# Patient Record
Sex: Female | Born: 1980 | Race: White | Hispanic: No | Marital: Married | Smoking: Never smoker
Health system: Southern US, Community
[De-identification: ages and names within clinical notes are randomized; demographics above are authoritative.]

## PROBLEM LIST (undated history)

## (undated) ENCOUNTER — Inpatient Hospital Stay (HOSPITAL_COMMUNITY): Payer: Self-pay

## (undated) DIAGNOSIS — R51 Headache: Secondary | ICD-10-CM

## (undated) DIAGNOSIS — F329 Major depressive disorder, single episode, unspecified: Secondary | ICD-10-CM

## (undated) DIAGNOSIS — F988 Other specified behavioral and emotional disorders with onset usually occurring in childhood and adolescence: Secondary | ICD-10-CM

## (undated) DIAGNOSIS — R519 Headache, unspecified: Secondary | ICD-10-CM

## (undated) DIAGNOSIS — E063 Autoimmune thyroiditis: Secondary | ICD-10-CM

## (undated) DIAGNOSIS — E039 Hypothyroidism, unspecified: Secondary | ICD-10-CM

## (undated) DIAGNOSIS — R1310 Dysphagia, unspecified: Secondary | ICD-10-CM

## (undated) DIAGNOSIS — F32A Depression, unspecified: Secondary | ICD-10-CM

## (undated) HISTORY — DX: Autoimmune thyroiditis: E06.3

## (undated) HISTORY — DX: Headache, unspecified: R51.9

## (undated) HISTORY — DX: Other specified behavioral and emotional disorders with onset usually occurring in childhood and adolescence: F98.8

## (undated) HISTORY — DX: Dysphagia, unspecified: R13.10

## (undated) HISTORY — DX: Headache: R51

## (undated) HISTORY — PX: NO PAST SURGERIES: SHX2092

---

## 2013-08-05 LAB — OB RESULTS CONSOLE ABO/RH: RH Type: POSITIVE

## 2013-08-05 LAB — OB RESULTS CONSOLE HEPATITIS B SURFACE ANTIGEN: HEP B S AG: NEGATIVE

## 2013-08-05 LAB — OB RESULTS CONSOLE GC/CHLAMYDIA
CHLAMYDIA, DNA PROBE: NEGATIVE
GC PROBE AMP, GENITAL: NEGATIVE

## 2013-08-05 LAB — OB RESULTS CONSOLE RPR: RPR: NONREACTIVE

## 2013-08-05 LAB — OB RESULTS CONSOLE ANTIBODY SCREEN: Antibody Screen: NEGATIVE

## 2013-08-05 LAB — OB RESULTS CONSOLE HIV ANTIBODY (ROUTINE TESTING): HIV: NONREACTIVE

## 2013-08-05 LAB — OB RESULTS CONSOLE RUBELLA ANTIBODY, IGM: Rubella: IMMUNE

## 2013-09-03 ENCOUNTER — Other Ambulatory Visit: Payer: Self-pay

## 2013-09-05 ENCOUNTER — Other Ambulatory Visit (HOSPITAL_COMMUNITY): Payer: Self-pay | Admitting: Specialist

## 2013-09-05 DIAGNOSIS — O358XX1 Maternal care for other (suspected) fetal abnormality and damage, fetus 1: Principal | ICD-10-CM

## 2013-09-05 DIAGNOSIS — IMO0001 Reserved for inherently not codable concepts without codable children: Secondary | ICD-10-CM

## 2013-09-05 DIAGNOSIS — O351XX0 Maternal care for (suspected) chromosomal abnormality in fetus, not applicable or unspecified: Secondary | ICD-10-CM

## 2013-09-05 DIAGNOSIS — IMO0002 Reserved for concepts with insufficient information to code with codable children: Secondary | ICD-10-CM

## 2013-09-09 ENCOUNTER — Ambulatory Visit (HOSPITAL_COMMUNITY)
Admission: RE | Admit: 2013-09-09 | Discharge: 2013-09-09 | Disposition: A | Discharge status: Home / Self Care | Payer: Managed Care, Other (non HMO) | Source: Ambulatory Visit | Attending: Specialist | Admitting: Specialist

## 2013-09-09 ENCOUNTER — Other Ambulatory Visit (HOSPITAL_COMMUNITY): Payer: Self-pay | Admitting: Specialist

## 2013-09-09 ENCOUNTER — Encounter (HOSPITAL_COMMUNITY): Payer: Self-pay

## 2013-09-09 DIAGNOSIS — O351XX0 Maternal care for (suspected) chromosomal abnormality in fetus, not applicable or unspecified: Secondary | ICD-10-CM

## 2013-09-09 DIAGNOSIS — O3510X0 Maternal care for (suspected) chromosomal abnormality in fetus, unspecified, not applicable or unspecified: Secondary | ICD-10-CM | POA: Insufficient documentation

## 2013-09-09 DIAGNOSIS — O358XX1 Maternal care for other (suspected) fetal abnormality and damage, fetus 1: Secondary | ICD-10-CM

## 2013-09-09 DIAGNOSIS — IMO0002 Reserved for concepts with insufficient information to code with codable children: Secondary | ICD-10-CM

## 2013-09-09 DIAGNOSIS — IMO0001 Reserved for inherently not codable concepts without codable children: Secondary | ICD-10-CM

## 2013-09-09 DIAGNOSIS — O358XX0 Maternal care for other (suspected) fetal abnormality and damage, not applicable or unspecified: Secondary | ICD-10-CM | POA: Insufficient documentation

## 2013-09-09 NOTE — Progress Notes (Signed)
Genetic Counseling  High-Risk Gestation Note  Appointment Date:  09/09/2013 Referred By: Jamey Reas, MD Date of Birth:  January 23, 1981 Partner:  Meagan Woods   Pregnancy History: [id number] Estimated Date of Delivery: 03/31/14 Estimated Gestational Age: 94w0dAttending: MRenella Cunas MD  I met with Mrs. Meagan Tesorieroand her husband, Mr. Meagan Woods for genetic counseling regarding an abnormal ultrasound finding.   Mrs. Meagan Starkhad a routine nuchal translucency ultrasound through her primary obstetrician's office, which revealed an increased nuchal translucency versus a cystic hygroma.  [facility].  A limited ultrasound was performed today.  The report will be documented separately.  Ultrasound today revealed a fetal cystic hygroma.    The couple was counseled that a cystic hygroma describes a septated fluid filled sac at the back of the neck that typically results from failure of the fetal lymphatic system.  We discussed the various etiologies for a hygroma including normal variation (immature lymphatic system), a chromosome condition, single gene condition, or a congenital anomaly (heart defect).    We discussed chromosomes, non-disjunction, age related risks for aneuploidy, and the ~50% ultrasound adjusted risk for a chromosome condition based on the finding of a cystic hygroma.  We discussed the common features of Down syndrome, Turner syndrome, and trisomies 13 and 18.  We also discussed that cystic hygromas can also be caused by a variety of other chromosome aberrations including: microdeletions, microduplications, and translocations.    We reviewed available screening options including noninvasive prenatal screening (NIPS)/cell free DNA (cfDNA) testing and detailed ultrasound.  They were counseled that screening tests are used to modify a patient's a priori  risk for aneuploidy, typically based on age. This estimate provides a pregnancy specific risk assessment. We reviewed the benefits and limitations of each option. Specifically, we discussed the conditions for which each test screens, the detection rates, and false positive rates of each. They were also counseled regarding diagnostic testing via CVS and amniocentesis. We reviewed the approximate 1 in 1774risk for complications for CVS and the approximate 1 in 3142-395risk for complications for amniocentesis, including spontaneous pregnancy loss.  Additionally, we discussed that microarray analysis can be performed on cells obtained from amniocentesis.  Microarray analysis is a molecular based technique in which a test sample of DNA (fetal) is compared to a reference (normal) genome in order to determine if the test sample has any extra or missing genetic information.  Microarray analysis allows for the detection of genetic deletions and duplications that are 1320times smaller than those identified by routine chromosome analysis.  Recent publications show that approximately 6% of patients with an abnormal fetal ultrasound and a normal fetal karyotype had a significant microdeletion/microduplication detected by prenatal microarray analysis.   After consideration of all the options, the couple declined NIPS, CVS, and amniocentesis at this time. They stated that they needed additional time to consider these screening and testing options and will contact our office should they elect to pursue any of them. They elected to proceed with follow-up ultrasound. Follow-up ultrasound was scheduled for 10/17/13, and detailed ultrasound scheduled for 11/17/13.       We also discussed that a cystic hygroma can be a feature of many underlying single gene conditions, such as Noonan syndrome, SLOS, and skeletal dysplasias.  We discussed that prenatal diagnosis is available for some single gene conditions, but that in most cases  targeted analysis is recommended by a medical geneticist following a post-natal physical exam and review of medical records.  If however, ultrasound findings or a positive family history strongly suggest an increased risk for a specific condition, testing may be performed prenatally.     Additionally, we discussed that congenital heart defects (CHD) are commonly associated with cystic hygromas.  The prevalence of a CHD is in the order of 6 times higher in fetuses with a cystic hygroma/increased nuchal translucency as compared to an unselected population.  There does not appear to be an association between any specific CHD and cystic hygromas.   We discussed that the chance for a CHD increases exponentially with increasing NT measurements.  For this reason, we discussed the option of a fetal echocardiogram, which will be available in the second trimester.    We discussed that the fetal prognosis is largely dependent upon the underlying cause of the hygroma, but that there are indications by ultrasound, such as hydrops, which significantly increase the likelihood of a poor prognosis.  She understands that cystic hygromas may worsen and progress to hydrops fetalis, improve and regress, or remain unchanged at follow up ultrasounds.  The majority of fetuses with a normal karyotype, normal detailed anatomy ultrasound, and normal fetal echocardiogram will have an uneventful outcome.  We discussed that while many of the causes of a cystic hygroma can be investigated during pregnancy, not all conditions and causes can be determined prenatally.     Both family histories were reviewed and found to be noncontributory for birth defects, intellectual disability, and known genetic conditions. Without further information regarding the provided family history, an accurate genetic risk cannot be calculated. Further genetic counseling is warranted if more information is obtained.   [name] was provided with written  information regarding cystic fibrosis (CF) including the carrier frequency and incidence in the Caucasian population, the availability of carrier testing and prenatal diagnosis if indicated.  In addition, we discussed that CF is routinely screened for as part of the [place] newborn screening panel.  She reported that CF carrier screening was likely done in her previous pregnancy and was normal. We do not currently have documentation from the patient's previous pregnancy.    [name] denied exposure to environmental toxins or chemical agents. She denied the use of alcohol, tobacco or street drugs. She denied significant viral illnesses during the course of her pregnancy.  Her medication history was significant for hypothyroidism, for which she is taking Synthroid.    I counseled the couple regarding the above risks and available options.  The approximate face-to-face time with the genetic counselor was 35 minutes.     Chipper Oman, MS Certified Genetic Counselor 09/09/2013

## 2013-09-11 ENCOUNTER — Other Ambulatory Visit (HOSPITAL_COMMUNITY): Payer: Self-pay | Admitting: Specialist

## 2013-09-11 ENCOUNTER — Telehealth (HOSPITAL_COMMUNITY): Payer: Self-pay | Admitting: MS"

## 2013-09-11 DIAGNOSIS — IMO0001 Reserved for inherently not codable concepts without codable children: Secondary | ICD-10-CM

## 2013-09-11 DIAGNOSIS — O358XX1 Maternal care for other (suspected) fetal abnormality and damage, fetus 1: Principal | ICD-10-CM

## 2013-09-11 NOTE — Telephone Encounter (Signed)
[name] called back to review testing options. Patient was seen in our office on 6/23 and had ultrasound finding of cystic hygroma. We reviewed screening (noninvasive prenatal screening) versus diagnostic testing (CVS and amniocentesis) for chromosome conditions. We reviewed the conditions for which each screens and tests and reviewed the risks, benefits, and limitations of each. [name] stated that she had been reading and was leaning towards having a CVS procedure given the timing and amount of information it can provide compared to other screening and testing options. We specifically reviewed the risk of complications of approximately 1 in 100 and the chances for maternal cell contamination and confined placental mosaicism. [name] indicated that she would not alter her pregnancy course but was interested in pursuing this testing for information gathering and planning purposes. Discussed that Dr. Nelda Severe is back in the [facility] office on Tuesday, 6/30, and that CVS could be arranged for that date. Planned to investigate further regarding the time for CVS to be scheduled that date and stated that I would call her back.   Santiago Glad Corneliussen 09/11/2013 10:55 AM    Called [name] back to inform her that CVS is scheduled for Tuesday, 6/30 at 9:15 am at the [facility] [place] with Dr. Nelda Severe.   Santiago Glad Corneliussen 10:58 AM 09/11/2013

## 2013-09-16 ENCOUNTER — Other Ambulatory Visit: Payer: Self-pay

## 2013-09-16 ENCOUNTER — Ambulatory Visit (HOSPITAL_COMMUNITY)
Admission: RE | Admit: 2013-09-16 | Discharge: 2013-09-16 | Disposition: A | Discharge status: Home / Self Care | Payer: Managed Care, Other (non HMO) | Source: Ambulatory Visit | Attending: Specialist | Admitting: Specialist

## 2013-09-16 ENCOUNTER — Encounter (HOSPITAL_COMMUNITY): Payer: Self-pay

## 2013-09-16 DIAGNOSIS — Z3689 Encounter for other specified antenatal screening: Secondary | ICD-10-CM | POA: Insufficient documentation

## 2013-09-16 DIAGNOSIS — O358XX0 Maternal care for other (suspected) fetal abnormality and damage, not applicable or unspecified: Secondary | ICD-10-CM | POA: Insufficient documentation

## 2013-09-16 DIAGNOSIS — O30009 Twin pregnancy, unspecified number of placenta and unspecified number of amniotic sacs, unspecified trimester: Secondary | ICD-10-CM | POA: Insufficient documentation

## 2013-09-16 DIAGNOSIS — IMO0001 Reserved for inherently not codable concepts without codable children: Secondary | ICD-10-CM

## 2013-09-16 DIAGNOSIS — O358XX1 Maternal care for other (suspected) fetal abnormality and damage, fetus 1: Secondary | ICD-10-CM

## 2013-09-16 LAB — ROUTINE CHROMOSOME - KARYOTYPE + FISH

## 2013-09-17 ENCOUNTER — Telehealth (HOSPITAL_COMMUNITY): Payer: Self-pay | Admitting: MS"

## 2013-09-17 NOTE — Telephone Encounter (Signed)
Called Meagan Woods to discuss the preliminary FISH results from her CVS. We reviewed that these are within normal limits.  We again discussed the limitations of FISH and that final results are still pending and will be available in 1-2 weeks.  Patient reported that she has not noticed any concerning symptoms or complications following the procedure.  All questions were answered to her satisfaction, she was encouraged to call with additional questions or concerns.  Chipper Oman,  MS Certified Genetic Counselor 09/17/2013 11:54 AM

## 2013-09-17 NOTE — Telephone Encounter (Signed)
Left message for patient to return call.   Meagan Woods 09/17/2013 11:38 AM

## 2013-09-18 ENCOUNTER — Other Ambulatory Visit (HOSPITAL_COMMUNITY): Payer: Self-pay | Admitting: Maternal and Fetal Medicine

## 2013-09-18 DIAGNOSIS — IMO0002 Reserved for concepts with insufficient information to code with codable children: Secondary | ICD-10-CM

## 2013-09-18 DIAGNOSIS — O358XX1 Maternal care for other (suspected) fetal abnormality and damage, fetus 1: Secondary | ICD-10-CM

## 2013-09-18 DIAGNOSIS — Z0489 Encounter for examination and observation for other specified reasons: Secondary | ICD-10-CM

## 2013-09-18 DIAGNOSIS — IMO0001 Reserved for inherently not codable concepts without codable children: Secondary | ICD-10-CM

## 2013-09-18 DIAGNOSIS — O283 Abnormal ultrasonic finding on antenatal screening of mother: Secondary | ICD-10-CM

## 2013-09-22 ENCOUNTER — Telehealth (HOSPITAL_COMMUNITY): Payer: Self-pay | Admitting: MS"

## 2013-09-22 NOTE — Telephone Encounter (Signed)
[name] called back to ask about the measurement of the cystic hygroma. Discussed that her initial ultrasound report with [place] reports a nuchal translucency measurement of 4.1 mm. Patient had no other questions at this time.   Santiago Glad [name] 09/22/2013 9:00 AM

## 2013-09-26 ENCOUNTER — Telehealth (HOSPITAL_COMMUNITY): Payer: Self-pay | Admitting: MS"

## 2013-09-26 NOTE — Telephone Encounter (Signed)
Called Ambert Virrueta to discuss the final karyotype results from her CVS. We reviewed that these are within normal limits (72, XY).  We discussed the availability of prenatal microarray analysis. Microarray analysis allows for the detection of genetic deletions and duplications that are 833 times smaller than those identified by routine chromosome analysis.  We discussed that publications show that approximately 6% of patients with an abnormal fetal ultrasound and a normal fetal karyotype had a microdeletion/microduplication detected by prenatal microarray analysis. However, the significance of that copy number variant may not be known. Mrs. Cambre planned to discuss this option with her husband and call us back regarding whether or not they would like to pursue this testing. We reviewed that this testing did not/does not assess for single gene conditions. Mrs. Trigueros asked about next steps for the pregnancy. Reviewed the option of anatomy ultrasound, which is scheduled in our office, as well as fetal echocardiogram, which can be facilitated at their next visit. All questions were answered to her satisfaction, she was encouraged to call with additional questions or concerns.  Chipper Oman, MS Insurance risk surveyor

## 2013-09-30 ENCOUNTER — Telehealth (HOSPITAL_COMMUNITY): Payer: Self-pay | Admitting: MS"

## 2013-09-30 NOTE — Telephone Encounter (Signed)
[name] called and left message stating that she and her husband do not want to pursue with chromosome microarray analysis from CVS.   [name] 09/30/2013 11:09 AM

## 2013-10-17 ENCOUNTER — Encounter (HOSPITAL_COMMUNITY): Payer: Self-pay

## 2013-10-17 ENCOUNTER — Ambulatory Visit (HOSPITAL_COMMUNITY)
Admission: RE | Admit: 2013-10-17 | Discharge: 2013-10-17 | Disposition: A | Discharge status: Home / Self Care | Payer: Managed Care, Other (non HMO) | Source: Ambulatory Visit | Attending: Specialist | Admitting: Specialist

## 2013-10-17 DIAGNOSIS — O358XX0 Maternal care for other (suspected) fetal abnormality and damage, not applicable or unspecified: Secondary | ICD-10-CM | POA: Insufficient documentation

## 2013-10-17 DIAGNOSIS — O283 Abnormal ultrasonic finding on antenatal screening of mother: Secondary | ICD-10-CM

## 2013-10-17 DIAGNOSIS — Z3689 Encounter for other specified antenatal screening: Secondary | ICD-10-CM | POA: Insufficient documentation

## 2013-10-17 DIAGNOSIS — O289 Unspecified abnormal findings on antenatal screening of mother: Secondary | ICD-10-CM | POA: Insufficient documentation

## 2013-10-17 DIAGNOSIS — O358XX1 Maternal care for other (suspected) fetal abnormality and damage, fetus 1: Secondary | ICD-10-CM

## 2013-10-17 DIAGNOSIS — IMO0001 Reserved for inherently not codable concepts without codable children: Secondary | ICD-10-CM

## 2013-10-31 ENCOUNTER — Encounter (HOSPITAL_COMMUNITY): Payer: Self-pay

## 2013-10-31 ENCOUNTER — Ambulatory Visit (HOSPITAL_COMMUNITY)
Admission: RE | Admit: 2013-10-31 | Discharge: 2013-10-31 | Disposition: A | Discharge status: Home / Self Care | Payer: Managed Care, Other (non HMO) | Source: Ambulatory Visit | Attending: Specialist | Admitting: Specialist

## 2013-10-31 VITALS — BP 127/68 | HR 104 | Wt 173.0 lb

## 2013-10-31 DIAGNOSIS — O289 Unspecified abnormal findings on antenatal screening of mother: Secondary | ICD-10-CM | POA: Diagnosis not present

## 2013-10-31 DIAGNOSIS — O283 Abnormal ultrasonic finding on antenatal screening of mother: Secondary | ICD-10-CM

## 2013-10-31 DIAGNOSIS — O358XX0 Maternal care for other (suspected) fetal abnormality and damage, not applicable or unspecified: Secondary | ICD-10-CM | POA: Diagnosis present

## 2013-10-31 DIAGNOSIS — Z1389 Encounter for screening for other disorder: Secondary | ICD-10-CM | POA: Insufficient documentation

## 2013-10-31 DIAGNOSIS — Z0489 Encounter for examination and observation for other specified reasons: Secondary | ICD-10-CM

## 2013-10-31 DIAGNOSIS — Z363 Encounter for antenatal screening for malformations: Secondary | ICD-10-CM | POA: Insufficient documentation

## 2013-10-31 DIAGNOSIS — IMO0001 Reserved for inherently not codable concepts without codable children: Secondary | ICD-10-CM

## 2013-10-31 DIAGNOSIS — IMO0002 Reserved for concepts with insufficient information to code with codable children: Secondary | ICD-10-CM

## 2013-10-31 DIAGNOSIS — O358XX1 Maternal care for other (suspected) fetal abnormality and damage, fetus 1: Secondary | ICD-10-CM

## 2013-12-09 ENCOUNTER — Ambulatory Visit (HOSPITAL_COMMUNITY)
Admission: RE | Admit: 2013-12-09 | Discharge: 2013-12-09 | Disposition: A | Discharge status: Home / Self Care | Payer: Managed Care, Other (non HMO) | Source: Ambulatory Visit | Attending: Specialist | Admitting: Specialist

## 2013-12-09 ENCOUNTER — Encounter (HOSPITAL_COMMUNITY): Payer: Self-pay

## 2013-12-09 DIAGNOSIS — O358XX0 Maternal care for other (suspected) fetal abnormality and damage, not applicable or unspecified: Secondary | ICD-10-CM | POA: Diagnosis not present

## 2013-12-09 DIAGNOSIS — IMO0001 Reserved for inherently not codable concepts without codable children: Secondary | ICD-10-CM

## 2013-12-09 DIAGNOSIS — O358XX1 Maternal care for other (suspected) fetal abnormality and damage, fetus 1: Secondary | ICD-10-CM

## 2013-12-26 ENCOUNTER — Encounter: Payer: Self-pay | Admitting: Specialist

## 2014-01-09 ENCOUNTER — Other Ambulatory Visit (HOSPITAL_COMMUNITY): Payer: Self-pay | Admitting: Maternal and Fetal Medicine

## 2014-01-09 DIAGNOSIS — E039 Hypothyroidism, unspecified: Secondary | ICD-10-CM

## 2014-01-09 DIAGNOSIS — O99283 Endocrine, nutritional and metabolic diseases complicating pregnancy, third trimester: Secondary | ICD-10-CM

## 2014-01-09 DIAGNOSIS — D181 Lymphangioma, any site: Secondary | ICD-10-CM

## 2014-01-16 LAB — OB RESULTS CONSOLE GBS: GBS: NEGATIVE

## 2014-01-19 ENCOUNTER — Encounter (HOSPITAL_COMMUNITY): Payer: Self-pay

## 2014-01-23 ENCOUNTER — Encounter (HOSPITAL_COMMUNITY): Payer: Self-pay

## 2014-01-23 ENCOUNTER — Other Ambulatory Visit (HOSPITAL_COMMUNITY): Payer: Self-pay | Admitting: Maternal and Fetal Medicine

## 2014-01-23 ENCOUNTER — Ambulatory Visit (HOSPITAL_COMMUNITY)
Admission: RE | Admit: 2014-01-23 | Discharge: 2014-01-23 | Disposition: A | Discharge status: Home / Self Care | Payer: Managed Care, Other (non HMO) | Source: Ambulatory Visit | Attending: Obstetrics and Gynecology | Admitting: Obstetrics and Gynecology

## 2014-01-23 VITALS — BP 115/62 | HR 93 | Wt 186.0 lb

## 2014-01-23 DIAGNOSIS — D181 Lymphangioma, any site: Secondary | ICD-10-CM

## 2014-01-23 DIAGNOSIS — O403XX0 Polyhydramnios, third trimester, not applicable or unspecified: Secondary | ICD-10-CM

## 2014-01-23 DIAGNOSIS — O99283 Endocrine, nutritional and metabolic diseases complicating pregnancy, third trimester: Secondary | ICD-10-CM | POA: Diagnosis not present

## 2014-01-23 DIAGNOSIS — Z3A30 30 weeks gestation of pregnancy: Secondary | ICD-10-CM | POA: Diagnosis not present

## 2014-01-23 DIAGNOSIS — IMO0002 Reserved for concepts with insufficient information to code with codable children: Secondary | ICD-10-CM | POA: Insufficient documentation

## 2014-01-23 DIAGNOSIS — O358XX0 Maternal care for other (suspected) fetal abnormality and damage, not applicable or unspecified: Secondary | ICD-10-CM | POA: Insufficient documentation

## 2014-01-23 DIAGNOSIS — E039 Hypothyroidism, unspecified: Secondary | ICD-10-CM | POA: Insufficient documentation

## 2014-01-23 HISTORY — DX: Major depressive disorder, single episode, unspecified: F32.9

## 2014-01-23 HISTORY — DX: Hypothyroidism, unspecified: E03.9

## 2014-01-23 HISTORY — DX: Depression, unspecified: F32.A

## 2014-02-06 ENCOUNTER — Other Ambulatory Visit (HOSPITAL_COMMUNITY): Payer: Self-pay | Admitting: Maternal and Fetal Medicine

## 2014-02-06 ENCOUNTER — Ambulatory Visit (HOSPITAL_COMMUNITY)
Admission: RE | Admit: 2014-02-06 | Discharge: 2014-02-06 | Disposition: A | Discharge status: Home / Self Care | Payer: Managed Care, Other (non HMO) | Source: Ambulatory Visit | Attending: Obstetrics and Gynecology | Admitting: Obstetrics and Gynecology

## 2014-02-06 ENCOUNTER — Encounter (HOSPITAL_COMMUNITY): Payer: Self-pay

## 2014-02-06 DIAGNOSIS — Z3A32 32 weeks gestation of pregnancy: Secondary | ICD-10-CM | POA: Diagnosis not present

## 2014-02-06 DIAGNOSIS — O99283 Endocrine, nutritional and metabolic diseases complicating pregnancy, third trimester: Secondary | ICD-10-CM | POA: Diagnosis not present

## 2014-02-06 DIAGNOSIS — D181 Lymphangioma, any site: Secondary | ICD-10-CM

## 2014-02-06 DIAGNOSIS — O358XX0 Maternal care for other (suspected) fetal abnormality and damage, not applicable or unspecified: Secondary | ICD-10-CM | POA: Insufficient documentation

## 2014-02-06 DIAGNOSIS — E039 Hypothyroidism, unspecified: Secondary | ICD-10-CM | POA: Insufficient documentation

## 2014-02-06 DIAGNOSIS — O403XX0 Polyhydramnios, third trimester, not applicable or unspecified: Secondary | ICD-10-CM | POA: Diagnosis present

## 2014-02-06 DIAGNOSIS — O359XX0 Maternal care for (suspected) fetal abnormality and damage, unspecified, not applicable or unspecified: Secondary | ICD-10-CM | POA: Insufficient documentation

## 2014-02-19 ENCOUNTER — Ambulatory Visit (HOSPITAL_COMMUNITY)
Admission: RE | Admit: 2014-02-19 | Discharge: 2014-02-19 | Disposition: A | Discharge status: Home / Self Care | Payer: Managed Care, Other (non HMO) | Source: Ambulatory Visit | Attending: Obstetrics and Gynecology | Admitting: Obstetrics and Gynecology

## 2014-02-19 ENCOUNTER — Other Ambulatory Visit: Payer: Self-pay

## 2014-02-19 ENCOUNTER — Encounter (HOSPITAL_COMMUNITY): Payer: Self-pay

## 2014-02-19 ENCOUNTER — Other Ambulatory Visit (HOSPITAL_COMMUNITY): Payer: Self-pay | Admitting: Obstetrics and Gynecology

## 2014-02-19 DIAGNOSIS — E039 Hypothyroidism, unspecified: Secondary | ICD-10-CM | POA: Insufficient documentation

## 2014-02-19 DIAGNOSIS — D181 Lymphangioma, any site: Secondary | ICD-10-CM | POA: Insufficient documentation

## 2014-02-19 DIAGNOSIS — Z3A34 34 weeks gestation of pregnancy: Secondary | ICD-10-CM | POA: Diagnosis not present

## 2014-02-19 DIAGNOSIS — IMO0001 Reserved for inherently not codable concepts without codable children: Secondary | ICD-10-CM

## 2014-02-19 DIAGNOSIS — O359XX1 Maternal care for (suspected) fetal abnormality and damage, unspecified, fetus 1: Secondary | ICD-10-CM

## 2014-02-19 DIAGNOSIS — O403XX1 Polyhydramnios, third trimester, fetus 1: Secondary | ICD-10-CM

## 2014-02-19 DIAGNOSIS — O403XX0 Polyhydramnios, third trimester, not applicable or unspecified: Secondary | ICD-10-CM | POA: Diagnosis present

## 2014-02-19 DIAGNOSIS — O99283 Endocrine, nutritional and metabolic diseases complicating pregnancy, third trimester: Secondary | ICD-10-CM | POA: Diagnosis not present

## 2014-02-19 DIAGNOSIS — G930 Cerebral cysts: Secondary | ICD-10-CM

## 2014-02-19 DIAGNOSIS — O358XX1 Maternal care for other (suspected) fetal abnormality and damage, fetus 1: Secondary | ICD-10-CM

## 2014-02-19 DIAGNOSIS — O358XX0 Maternal care for other (suspected) fetal abnormality and damage, not applicable or unspecified: Secondary | ICD-10-CM | POA: Insufficient documentation

## 2014-03-16 ENCOUNTER — Ambulatory Visit (HOSPITAL_COMMUNITY)
Admission: RE | Admit: 2014-03-16 | Discharge: 2014-03-16 | Disposition: A | Discharge status: Home / Self Care | Payer: Managed Care, Other (non HMO) | Source: Ambulatory Visit | Attending: Obstetrics and Gynecology | Admitting: Obstetrics and Gynecology

## 2014-03-16 ENCOUNTER — Encounter (HOSPITAL_COMMUNITY): Payer: Self-pay

## 2014-03-16 DIAGNOSIS — O99280 Endocrine, nutritional and metabolic diseases complicating pregnancy, unspecified trimester: Secondary | ICD-10-CM

## 2014-03-16 DIAGNOSIS — O358XX0 Maternal care for other (suspected) fetal abnormality and damage, not applicable or unspecified: Secondary | ICD-10-CM | POA: Diagnosis not present

## 2014-03-16 DIAGNOSIS — E039 Hypothyroidism, unspecified: Secondary | ICD-10-CM | POA: Diagnosis not present

## 2014-03-16 DIAGNOSIS — O403XX1 Polyhydramnios, third trimester, fetus 1: Secondary | ICD-10-CM

## 2014-03-16 DIAGNOSIS — G930 Cerebral cysts: Secondary | ICD-10-CM

## 2014-03-16 DIAGNOSIS — O99283 Endocrine, nutritional and metabolic diseases complicating pregnancy, third trimester: Secondary | ICD-10-CM | POA: Diagnosis not present

## 2014-03-16 DIAGNOSIS — O350XX0 Maternal care for (suspected) central nervous system malformation in fetus, not applicable or unspecified: Secondary | ICD-10-CM | POA: Diagnosis not present

## 2014-03-16 DIAGNOSIS — O403XX0 Polyhydramnios, third trimester, not applicable or unspecified: Secondary | ICD-10-CM | POA: Insufficient documentation

## 2014-03-16 DIAGNOSIS — IMO0001 Reserved for inherently not codable concepts without codable children: Secondary | ICD-10-CM

## 2014-03-16 DIAGNOSIS — O359XX1 Maternal care for (suspected) fetal abnormality and damage, unspecified, fetus 1: Secondary | ICD-10-CM

## 2014-03-16 DIAGNOSIS — Z3A37 37 weeks gestation of pregnancy: Secondary | ICD-10-CM | POA: Diagnosis not present

## 2014-03-16 DIAGNOSIS — O358XX1 Maternal care for other (suspected) fetal abnormality and damage, fetus 1: Secondary | ICD-10-CM

## 2014-03-19 ENCOUNTER — Ambulatory Visit (HOSPITAL_COMMUNITY): Payer: Managed Care, Other (non HMO)

## 2014-03-23 ENCOUNTER — Telehealth (HOSPITAL_COMMUNITY): Payer: Self-pay | Admitting: *Deleted

## 2014-03-23 ENCOUNTER — Encounter (HOSPITAL_COMMUNITY): Payer: Self-pay | Admitting: *Deleted

## 2014-03-23 NOTE — Telephone Encounter (Signed)
Preadmission screen  

## 2014-03-24 ENCOUNTER — Inpatient Hospital Stay (HOSPITAL_COMMUNITY): Payer: Managed Care, Other (non HMO) | Admitting: Anesthesiology

## 2014-03-24 ENCOUNTER — Inpatient Hospital Stay (HOSPITAL_COMMUNITY)
Admission: RE | Admit: 2014-03-24 | Discharge: 2014-03-26 | DRG: 775 | Disposition: A | Discharge status: Home / Self Care | Payer: Managed Care, Other (non HMO) | Source: Ambulatory Visit | Attending: Obstetrics and Gynecology | Admitting: Obstetrics and Gynecology

## 2014-03-24 ENCOUNTER — Encounter (HOSPITAL_COMMUNITY): Payer: Self-pay

## 2014-03-24 ENCOUNTER — Inpatient Hospital Stay (HOSPITAL_COMMUNITY)
Admission: AD | Admit: 2014-03-24 | Discharge: 2014-03-24 | Disposition: A | Discharge status: Home / Self Care | Payer: Managed Care, Other (non HMO) | Source: Ambulatory Visit | Attending: Obstetrics and Gynecology | Admitting: Obstetrics and Gynecology

## 2014-03-24 DIAGNOSIS — Z3A39 39 weeks gestation of pregnancy: Secondary | ICD-10-CM | POA: Diagnosis present

## 2014-03-24 DIAGNOSIS — O139 Gestational [pregnancy-induced] hypertension without significant proteinuria, unspecified trimester: Secondary | ICD-10-CM | POA: Diagnosis present

## 2014-03-24 DIAGNOSIS — Z3483 Encounter for supervision of other normal pregnancy, third trimester: Secondary | ICD-10-CM | POA: Diagnosis present

## 2014-03-24 LAB — CBC
HCT: 33.4 % — ABNORMAL LOW (ref 36.0–46.0)
Hemoglobin: 10.6 g/dL — ABNORMAL LOW (ref 12.0–15.0)
MCH: 25.9 pg — AB (ref 26.0–34.0)
MCHC: 31.7 g/dL (ref 30.0–36.0)
MCV: 81.5 fL (ref 78.0–100.0)
Platelets: 261 10*3/uL (ref 150–400)
RBC: 4.1 MIL/uL (ref 3.87–5.11)
RDW: 15.8 % — ABNORMAL HIGH (ref 11.5–15.5)
WBC: 12.1 10*3/uL — ABNORMAL HIGH (ref 4.0–10.5)

## 2014-03-24 LAB — TYPE AND SCREEN
ABO/RH(D): A POS
Antibody Screen: NEGATIVE

## 2014-03-24 MED ORDER — TERBUTALINE SULFATE 1 MG/ML IJ SOLN: 0.2500 mg | Freq: Once | INTRAMUSCULAR | Status: AC | PRN

## 2014-03-24 MED ORDER — MISOPROSTOL 25 MCG QUARTER TABLET
25.0000 ug | ORAL_TABLET | ORAL | Status: DC | PRN
  Administered 2014-03-24: 25 ug via VAGINAL
  Filled 2014-03-24: qty 0.25
  Filled 2014-03-24: qty 1

## 2014-03-24 MED ORDER — ONDANSETRON HCL 4 MG/2ML IJ SOLN
4.0000 mg | Freq: Four times a day (QID) | INTRAMUSCULAR | Status: DC | PRN
Start: 2014-03-24 — End: 2014-03-25

## 2014-03-24 MED ORDER — CITRIC ACID-SODIUM CITRATE 334-500 MG/5ML PO SOLN
30.0000 mL | ORAL | Status: DC | PRN
  Filled 2014-03-24: qty 15

## 2014-03-24 MED ORDER — OXYCODONE-ACETAMINOPHEN 5-325 MG PO TABS: 2.0000 | ORAL_TABLET | ORAL | Status: DC | PRN

## 2014-03-24 MED ORDER — ZOLPIDEM TARTRATE 5 MG PO TABS: 5.0000 mg | ORAL_TABLET | Freq: Every evening | ORAL | Status: DC | PRN

## 2014-03-24 MED ORDER — OXYTOCIN BOLUS FROM INFUSION
500.0000 mL | INTRAVENOUS | Status: DC
  Administered 2014-03-25: 500 mL via INTRAVENOUS

## 2014-03-24 MED ORDER — LACTATED RINGERS IV SOLN
500.0000 mL | Freq: Once | INTRAVENOUS | Status: AC
  Administered 2014-03-24: 500 mL via INTRAVENOUS

## 2014-03-24 MED ORDER — OXYTOCIN 40 UNITS IN LACTATED RINGERS INFUSION - SIMPLE MED
62.5000 mL/h | INTRAVENOUS | Status: DC
  Filled 2014-03-24: qty 1000

## 2014-03-24 MED ORDER — OXYCODONE-ACETAMINOPHEN 5-325 MG PO TABS: 1.0000 | ORAL_TABLET | ORAL | Status: DC | PRN

## 2014-03-24 MED ORDER — PHENYLEPHRINE 40 MCG/ML (10ML) SYRINGE FOR IV PUSH (FOR BLOOD PRESSURE SUPPORT)
80.0000 ug | PREFILLED_SYRINGE | INTRAVENOUS | Status: DC | PRN
  Filled 2014-03-24: qty 2

## 2014-03-24 MED ORDER — ACETAMINOPHEN 325 MG PO TABS: 650.0000 mg | ORAL_TABLET | ORAL | Status: DC | PRN

## 2014-03-24 MED ORDER — LACTATED RINGERS IV SOLN: 500.0000 mL | INTRAVENOUS | Status: DC | PRN

## 2014-03-24 MED ORDER — EPHEDRINE 5 MG/ML INJ
10.0000 mg | INTRAVENOUS | Status: DC | PRN
  Filled 2014-03-24: qty 2

## 2014-03-24 MED ORDER — BUTORPHANOL TARTRATE 1 MG/ML IJ SOLN
1.0000 mg | INTRAMUSCULAR | Status: DC | PRN
  Administered 2014-03-24: 1 mg via INTRAVENOUS
  Filled 2014-03-24: qty 1

## 2014-03-24 MED ORDER — FENTANYL 2.5 MCG/ML BUPIVACAINE 1/10 % EPIDURAL INFUSION (WH - ANES)
14.0000 mL/h | INTRAMUSCULAR | Status: DC | PRN
  Administered 2014-03-25: 14 mL/h via EPIDURAL
  Filled 2014-03-24: qty 125

## 2014-03-24 MED ORDER — LIDOCAINE HCL (PF) 1 % IJ SOLN
30.0000 mL | INTRAMUSCULAR | Status: DC | PRN
  Filled 2014-03-24: qty 30

## 2014-03-24 MED ORDER — PHENYLEPHRINE 40 MCG/ML (10ML) SYRINGE FOR IV PUSH (FOR BLOOD PRESSURE SUPPORT)
80.0000 ug | PREFILLED_SYRINGE | INTRAVENOUS | Status: DC | PRN
  Filled 2014-03-24: qty 20
  Filled 2014-03-24: qty 2

## 2014-03-24 MED ORDER — LACTATED RINGERS IV SOLN
INTRAVENOUS | Status: DC
  Administered 2014-03-24: 20:00:00 via INTRAVENOUS

## 2014-03-24 MED ORDER — DIPHENHYDRAMINE HCL 50 MG/ML IJ SOLN: 12.5000 mg | INTRAMUSCULAR | Status: DC | PRN

## 2014-03-24 NOTE — MAU Note (Signed)
Error, induction

## 2014-03-24 NOTE — Anesthesia Preprocedure Evaluation (Addendum)
Anesthesia Evaluation  Patient identified by MRN, date of birth, ID band Patient awake    Reviewed: Allergy & Precautions, NPO status , Patient's Chart, lab work & pertinent test results  Airway Mallampati: III  TM Distance: >3 FB Neck ROM: Full    Dental no notable dental hx. (+) Teeth Intact   Pulmonary neg pulmonary ROS,  breath sounds clear to auscultation  Pulmonary exam normal       Cardiovascular hypertension, negative cardio ROS  Rhythm:Regular Rate:Normal     Neuro/Psych  Headaches, PSYCHIATRIC DISORDERS Depression    GI/Hepatic negative GI ROS, Neg liver ROS,   Endo/Other  Hypothyroidism Morbid obesity  Renal/GU   negative genitourinary   Musculoskeletal negative musculoskeletal ROS (+)   Abdominal (+) + obese,   Peds  Hematology  (+) anemia ,   Anesthesia Other Findings   Reproductive/Obstetrics (+) Pregnancy                            Anesthesia Physical Anesthesia Plan  ASA: III  Anesthesia Plan: Epidural   Post-op Pain Management:    Induction:   Airway Management Planned: Natural Airway  Additional Equipment:   Intra-op Plan:   Post-operative Plan:   Informed Consent: I have reviewed the patients History and Physical, chart, labs and discussed the procedure including the risks, benefits and alternatives for the proposed anesthesia with the patient or authorized representative who has indicated his/her understanding and acceptance.     Plan Discussed with: Anesthesiologist  Anesthesia Plan Comments:        Anesthesia Quick Evaluation

## 2014-03-25 ENCOUNTER — Encounter (HOSPITAL_COMMUNITY): Payer: Self-pay

## 2014-03-25 LAB — ABO/RH: ABO/RH(D): A POS

## 2014-03-25 LAB — RPR

## 2014-03-25 MED ORDER — CITALOPRAM HYDROBROMIDE 20 MG PO TABS
20.0000 mg | ORAL_TABLET | Freq: Every day | ORAL | Status: DC
  Administered 2014-03-25: 20 mg via ORAL
  Filled 2014-03-25 (×2): qty 1

## 2014-03-25 MED ORDER — LIDOCAINE HCL (PF) 1 % IJ SOLN
INTRAMUSCULAR | Status: DC | PRN
  Administered 2014-03-25 (×2): 4 mL

## 2014-03-25 MED ORDER — OXYCODONE-ACETAMINOPHEN 5-325 MG PO TABS
1.0000 | ORAL_TABLET | ORAL | Status: DC | PRN
  Administered 2014-03-25: 1 via ORAL
  Filled 2014-03-25: qty 1

## 2014-03-25 MED ORDER — MEASLES, MUMPS & RUBELLA VAC ~~LOC~~ INJ: 0.5000 mL | INJECTION | Freq: Once | SUBCUTANEOUS | Status: DC

## 2014-03-25 MED ORDER — DIPHENHYDRAMINE HCL 25 MG PO CAPS: 25.0000 mg | ORAL_CAPSULE | Freq: Four times a day (QID) | ORAL | Status: DC | PRN

## 2014-03-25 MED ORDER — TETANUS-DIPHTH-ACELL PERTUSSIS 5-2.5-18.5 LF-MCG/0.5 IM SUSP: 0.5000 mL | Freq: Once | INTRAMUSCULAR | Status: DC

## 2014-03-25 MED ORDER — IBUPROFEN 600 MG PO TABS
600.0000 mg | ORAL_TABLET | Freq: Four times a day (QID) | ORAL | Status: DC
  Administered 2014-03-25 – 2014-03-26 (×7): 600 mg via ORAL
  Filled 2014-03-25 (×7): qty 1

## 2014-03-25 MED ORDER — ONDANSETRON HCL 4 MG/2ML IJ SOLN: 4.0000 mg | INTRAMUSCULAR | Status: DC | PRN

## 2014-03-25 MED ORDER — FENTANYL 2.5 MCG/ML BUPIVACAINE 1/10 % EPIDURAL INFUSION (WH - ANES)
INTRAMUSCULAR | Status: DC | PRN
  Administered 2014-03-25: 14 mL/h via EPIDURAL

## 2014-03-25 MED ORDER — OXYCODONE-ACETAMINOPHEN 5-325 MG PO TABS: 2.0000 | ORAL_TABLET | ORAL | Status: DC | PRN

## 2014-03-25 MED ORDER — BENZOCAINE-MENTHOL 20-0.5 % EX AERO
1.0000 "application " | INHALATION_SPRAY | CUTANEOUS | Status: DC | PRN
  Administered 2014-03-25: 1 via TOPICAL
  Filled 2014-03-25: qty 56

## 2014-03-25 MED ORDER — SENNOSIDES-DOCUSATE SODIUM 8.6-50 MG PO TABS
2.0000 | ORAL_TABLET | ORAL | Status: DC
  Administered 2014-03-26: 2 via ORAL
  Filled 2014-03-25: qty 2

## 2014-03-25 MED ORDER — WITCH HAZEL-GLYCERIN EX PADS: 1.0000 "application " | MEDICATED_PAD | CUTANEOUS | Status: DC | PRN

## 2014-03-25 MED ORDER — ONDANSETRON HCL 4 MG PO TABS: 4.0000 mg | ORAL_TABLET | ORAL | Status: DC | PRN

## 2014-03-25 MED ORDER — LEVOTHYROXINE SODIUM 25 MCG PO TABS
25.0000 ug | ORAL_TABLET | Freq: Every day | ORAL | Status: DC
  Administered 2014-03-25 – 2014-03-26 (×2): 25 ug via ORAL
  Filled 2014-03-25 (×3): qty 1

## 2014-03-25 MED ORDER — SIMETHICONE 80 MG PO CHEW: 80.0000 mg | CHEWABLE_TABLET | ORAL | Status: DC | PRN

## 2014-03-25 MED ORDER — DIBUCAINE 1 % RE OINT: 1.0000 "application " | TOPICAL_OINTMENT | RECTAL | Status: DC | PRN

## 2014-03-25 MED ORDER — PRENATAL MULTIVITAMIN CH
1.0000 | ORAL_TABLET | Freq: Every day | ORAL | Status: DC
  Administered 2014-03-25 – 2014-03-26 (×2): 1 via ORAL
  Filled 2014-03-25 (×2): qty 1

## 2014-03-25 MED ORDER — LANOLIN HYDROUS EX OINT: TOPICAL_OINTMENT | CUTANEOUS | Status: DC | PRN

## 2014-03-25 MED ORDER — MEDROXYPROGESTERONE ACETATE 150 MG/ML IM SUSP: 150.0000 mg | INTRAMUSCULAR | Status: DC | PRN

## 2014-03-25 NOTE — Progress Notes (Signed)
MOB was referred for history of depression/anxiety.  Referral is screened out by Clinical Social Worker because none of the following criteria appear to apply: -History of anxiety/depression during this pregnancy, or of post-partum depression. - Diagnosis of anxiety and/or depression within last 3 years - History of depression due to pregnancy loss/loss of child or -MOB's symptoms are currently being treated with medication and/or therapy.  CSW noted that the MOB is currently prescribed Celexa.  Per OB records, MOB has reported that symptoms are controlled with medications.   Please contact the Clinical Social Worker if needs arise or upon MOB request.

## 2014-03-25 NOTE — Anesthesia Procedure Notes (Signed)
Epidural Patient location during procedure: OB Start time: 03/25/2014 12:05 AM  Staffing Anesthesiologist: Dene Nazir A. Performed by: anesthesiologist   Preanesthetic Checklist Completed: patient identified, site marked, surgical consent, pre-op evaluation, timeout performed, IV checked, risks and benefits discussed and monitors and equipment checked  Epidural Patient position: sitting Prep: site prepped and draped and DuraPrep Patient monitoring: continuous pulse ox and blood pressure Approach: midline Location: L3-L4 Injection technique: LOR air  Needle:  Needle type: Tuohy  Needle gauge: 17 G Needle length: 9 cm and 9 Needle insertion depth: 5 cm cm Catheter type: closed end flexible Catheter size: 19 Gauge Catheter at skin depth: 10 cm Test dose: negative and Other  Assessment Events: blood not aspirated, injection not painful, no injection resistance, negative IV test and no paresthesia  Additional Notes Patient identified. Risks and benefits discussed including failed block, incomplete  Pain control, post dural puncture headache, nerve damage, paralysis, blood pressure Changes, nausea, vomiting, reactions to medications-both toxic and allergic and post Partum back pain. All questions were answered. Patient expressed understanding and wished to proceed. Sterile technique was used throughout procedure. Epidural site was Dressed with sterile barrier dressing. No paresthesias, signs of intravascular injection Or signs of intrathecal spread were encountered.  Patient was more comfortable after the epidural was dosed. Please see RN's note for documentation of vital signs and FHR which are stable.

## 2014-03-25 NOTE — Anesthesia Postprocedure Evaluation (Signed)
  Anesthesia Post-op Note  Patient: Meagan Woods  Procedure(s) Performed: * No procedures listed *  Patient Location: Mother/Baby  Anesthesia Type:Epidural  Level of Consciousness: awake, alert , oriented and patient cooperative  Airway and Oxygen Therapy: Patient Spontanous Breathing  Post-op Pain: mild  Post-op Assessment: Post-op Vital signs reviewed, Patient's Cardiovascular Status Stable, Respiratory Function Stable, Patent Airway, No headache, No backache, No residual numbness and No residual motor weakness  Post-op Vital Signs: Reviewed and stable  Last Vitals:  Filed Vitals:   03/25/14 0415  BP: 118/61  Pulse: 72  Temp: 36.7 C  Resp: 18    Complications: No apparent anesthesia complications

## 2014-03-25 NOTE — Progress Notes (Signed)
Called for delivery & upon arrival cvx c/c/+2 station After 2-3 pushes, FHT to 80s. No improvement with position change & O2. rec vacuum assist with next ctx Bell vacuum applied and infant delivered using gentle traction on next push Cord clamped and cut and infant taken to NICU delivery team Placenta delivered spontaneous and intact 1st degree lac repaired w/ 3-0 vicryl rapide Fundus firm.  EBL 450cc

## 2014-03-25 NOTE — H&P (Signed)
NAMEAKEELAH, SEPPALA NO.:  [id number]  MEDICAL RECORD NO.:  [id number]  LOCATION:                                 FACILITY:  PHYSICIAN:  Ralene Bathe. Matthew Saras, M.D.DATE OF BIRTH:  [birth year]  DATE OF ADMISSION: DATE OF DISCHARGE:                             HISTORY & PHYSICAL   CHIEF COMPLAINT:  For labor induction, term.  HISTORY OF PRESENT ILLNESS:  A 34 year old, G2, P1-0-0-1, EDD March 31, 2014, presents for 2-stage labor induction.  This patient __________ 29 weeks of pregnancy.  Early in pregnancy, she had a fetal cystic hygroma.  This was followed by __________ cystic hygroma had subsequently resolved, however, appears to be a VSD by fetal echo. Also, history of hypothyroidism, although the levels have been normal during this pregnancy.  GBS was negative.  Blood type is A positive.  PAST MEDICAL HISTORY:  ALLERGIES:  SULFA.  OBSTETRICAL HISTORY:  Vaginal delivery in 2010 in [place].  REVIEW OF SYSTEMS:  Significant for hypothyroidism, history of postpartum depression.  MEDICATIONS:  She is currently on Synthroid and Celexa along with her prenatal vitamins.  PHYSICAL EXAMINATION:  VITAL SIGNS:  Temp 98.2, blood pressure 120/72. HEENT: :  Unremarkable. NECK:  Supple without masses. LUNGS:  Clear. CARDIOVASCULAR:  Regular rate and rhythm without murmur, rubs or gallops. BREAST:  Not examined. __________fetal heart rate 140.  She is vertex.  Cervix __________ -3. EXTREMITIES:  Unremarkable. NEUROLOGIC:  Unremarkable.  IMPRESSION:  Term pregnancy, for 2-stage labor induction.  History of probable fetal ventricular septal defect as noted. Procedure for induction and protocol discussed with her.     Wynnie Pacetti M. Matthew Saras, M.D.     [facility]/MEDQ  D:  03/24/2014  T:  03/24/2014  Job:  [id number]

## 2014-03-25 NOTE — Progress Notes (Signed)
Post Partum Day 0 Subjective: no complaints, up ad lib, voiding and tolerating PO  Objective: Blood pressure 118/61, pulse 72, temperature 98.1 F (36.7 C), temperature source Oral, resp. rate 18, height 5\' 1"  (1.549 m), weight 196 lb (88.905 kg), SpO2 97 %, unknown if currently breastfeeding.  Physical Exam:  General: alert and cooperative Lochia: appropriate Uterine Fundus: firm Incision: healing well DVT Evaluation: No evidence of DVT seen on physical exam. Negative Homan's sign. No cords or calf tenderness. No significant calf/ankle edema.   Recent Labs  03/24/14 1945  HGB 10.6*  HCT 33.4*    Assessment/Plan: Plan for discharge tomorrow   LOS: 1 day   Kariem Wolfson G 03/25/2014, 7:23 AM

## 2014-03-26 LAB — CBC
HEMATOCRIT: 29.8 % — AB (ref 36.0–46.0)
HEMOGLOBIN: 9.9 g/dL — AB (ref 12.0–15.0)
MCH: 27.2 pg (ref 26.0–34.0)
MCHC: 33.2 g/dL (ref 30.0–36.0)
MCV: 81.9 fL (ref 78.0–100.0)
Platelets: 240 10*3/uL (ref 150–400)
RBC: 3.64 MIL/uL — ABNORMAL LOW (ref 3.87–5.11)
RDW: 16.1 % — ABNORMAL HIGH (ref 11.5–15.5)
WBC: 13.8 10*3/uL — ABNORMAL HIGH (ref 4.0–10.5)

## 2014-03-26 MED ORDER — IBUPROFEN 600 MG PO TABS: 600.0000 mg | ORAL_TABLET | Freq: Four times a day (QID) | ORAL | Status: DC

## 2014-03-26 NOTE — Discharge Summary (Signed)
Obstetric Discharge Summary Reason for Admission: induction of labor Prenatal Procedures: ultrasound Intrapartum Procedures: vacuum Postpartum Procedures: none Complications-Operative and Postpartum: 1 degree perineal laceration HEMOGLOBIN  Date Value Ref Range Status  03/26/2014 9.9* 12.0 - 15.0 g/dL Final   HCT  Date Value Ref Range Status  03/26/2014 29.8* 36.0 - 46.0 % Final    Physical Exam:  General: alert and cooperative Lochia: appropriate Uterine Fundus: firm Incision: healing well DVT Evaluation: No evidence of DVT seen on physical exam. Negative Homan's sign. No cords or calf tenderness. No significant calf/ankle edema.  Discharge Diagnoses: Term Pregnancy-delivered  Discharge Information: Date: 03/26/2014 Activity: pelvic rest Diet: routine Medications: PNV, Ibuprofen and synthroid and celexa Condition: stable Instructions: refer to practice specific booklet Discharge to: home   Newborn Data: Live born female  Birth Weight: 6 lb 14.8 oz (3141 g) APGAR: 7, 9  Home with mother and desires baby circ prior to discharge.  CURTIS,CAROL G 03/26/2014, 8:07 AM

## 2014-12-24 ENCOUNTER — Ambulatory Visit: Payer: Self-pay | Admitting: "Endocrinology

## 2015-02-05 ENCOUNTER — Encounter: Payer: Self-pay | Admitting: "Endocrinology

## 2015-02-05 ENCOUNTER — Ambulatory Visit: Payer: Self-pay | Admitting: "Endocrinology

## 2015-02-05 ENCOUNTER — Ambulatory Visit (INDEPENDENT_AMBULATORY_CARE_PROVIDER_SITE_OTHER): Payer: Managed Care, Other (non HMO) | Admitting: "Endocrinology

## 2015-02-05 VITALS — BP 114/72 | HR 83 | Ht 60.0 in | Wt 175.6 lb

## 2015-02-05 DIAGNOSIS — E039 Hypothyroidism, unspecified: Secondary | ICD-10-CM

## 2015-02-05 MED ORDER — LEVOTHYROXINE SODIUM 75 MCG PO TABS: 75.0000 ug | ORAL_TABLET | Freq: Every day | ORAL | Status: DC

## 2015-02-05 NOTE — Progress Notes (Signed)
Subjective:    Patient ID: Meagan Woods, female    DOB: [birth year], PCP Meagan Neu, MD   Past Medical History  Diagnosis Date  . Hypothyroidism   . Depression   . Headache    Past Surgical History  Procedure Laterality Date  . No past surgeries     Social History   Social History  . Marital Status: Married    Spouse Name: N/A  . Number of Children: N/A  . Years of Education: N/A   Social History Main Topics  . Smoking status: Never Smoker   . Smokeless tobacco: Never Used  . Alcohol Use: No  . Drug Use: No  . Sexual Activity: Yes   Other Topics Concern  . None   Social History Narrative   Outpatient Encounter Prescriptions as of 02/05/2015  Medication Sig  . citalopram (CELEXA) 20 MG tablet Take 20 mg by mouth at bedtime.  Marland Kitchen ibuprofen (ADVIL,MOTRIN) 600 MG tablet Take 1 tablet (600 mg total) by mouth every 6 (six) hours.  Marland Kitchen levothyroxine (SYNTHROID, LEVOTHROID) 75 MCG tablet Take 1 tablet (75 mcg total) by mouth daily before breakfast.  . [DISCONTINUED] levothyroxine (SYNTHROID, LEVOTHROID) 25 MCG tablet Take 25 mcg by mouth daily before breakfast.  . Prenatal Vit-Fe Fumarate-FA (PRENATAL MULTIVITAMIN) TABS tablet Take 1 tablet by mouth at bedtime.   No facility-administered encounter medications on file as of 02/05/2015.   ALLERGIES: Allergies  Allergen Reactions  . Sulfa Antibiotics Rash   VACCINATION STATUS: Immunization History  Administered Date(s) Administered  . Influenza-Unspecified 12/18/2013  . Tdap 01/14/2014    HPI Meagan Woods is a 34 yr old female with no major medical problems. She is here to follow-up for hyponatremia. She is on levothyroxine 50 g by mouth every morning. She is compliant. She feels better. She is losing weight, has more energy, denies heat or cold intolerance.  She denies palpitations, nor menstrual cycle abnormality. she denies hx of goiter, no neck surgery. She did have postpartum thyroiditis   Review of  Systems  Constitutional: no weight gain/loss, no fatigue, no subjective hyperthermia/hypothermia Eyes: no blurry vision, no xerophthalmia ENT: no sore throat, no nodules palpated in throat, no dysphagia/odynophagia, no hoarseness Cardiovascular: no CP/SOB/palpitations/leg swelling Respiratory: no cough/SOB Gastrointestinal: no N/V/D/C Musculoskeletal: no muscle/joint aches Skin: no rashes Neurological: no tremors/numbness/tingling/dizziness Psychiatric: no depression/anxiety  Objective:    BP 114/72 mmHg  Pulse 83  Ht 5' (1.524 m)  Wt 175 lb 9.6 oz (79.652 kg)  BMI 34.29 kg/m2  SpO2 97%  Wt Readings from Last 3 Encounters:  02/05/15 175 lb 9.6 oz (79.652 kg)  03/24/14 196 lb (88.905 kg)  03/16/14 192 lb 8 oz (87.317 kg)    Physical Exam Constitutional: overweight, in NAD Eyes: PERRLA, EOMI, no exophthalmos ENT: moist mucous membranes, no thyromegaly, no cervical lymphadenopathy Cardiovascular: RRR, No MRG Respiratory: CTA B Gastrointestinal: abdomen soft, NT, ND, BS+ Musculoskeletal: no deformities, strength intact in all 4 Skin: moist, warm, no rashes Neurological: no tremor with outstretched hands, DTR normal in all 4   On 01/29/2015 her TSH was 2.37, free T4 1 0.2, and vitamin D 45.3. Assessment & Plan:   1. Primary hypothyroidism She is on levothyroxine clinically responding. Her thyroid function tests are improving. i will increase her dose to 75 g by mouth every morning.  - We discussed about correct intake of levothyroxine, at fasting, with water, separated by at least 30 minutes from breakfast, and separated by more than 4 hours from calcium,  iron, multivitamins, acid reflux medications (PPIs). -Patient is made aware of the fact that thyroid hormone replacement is needed for life, dose to be adjusted by periodic monitoring of thyroid function tests. -I counseled her to give me a call if she confirms another pregnancy since she will need higher dose of  levothyroxine during early first trimester.  - I advised patient to maintain close follow up with their PCP for primary care needs.  Follow up plan: Return in about 5 months (around 07/06/2015) for underactive thyroid, follow up with pre-visit labs.  Meagan Lloyd, MD Phone: [phone]  Fax: [phone]   02/05/2015, 4:24 PM

## 2015-03-03 ENCOUNTER — Telehealth: Payer: Self-pay | Admitting: "Endocrinology

## 2015-03-03 NOTE — Telephone Encounter (Signed)
She would like the nurse to call her back. She is concerned of excessive sweating that has been going on a couple of weeks.

## 2015-03-03 NOTE — Telephone Encounter (Signed)
Left message for pt to get her labs done and make appt with Dr Dorris Fetch. She missed appt on 02-05-15

## 2015-03-04 IMAGING — US US OB LIMITED
1 series · 13 of 26 positions shown · non-contrast
Comparison: none

[Series 1: us ob limited · 0.07mm/px · 13 of 26 slices shown]
[im 2/26]
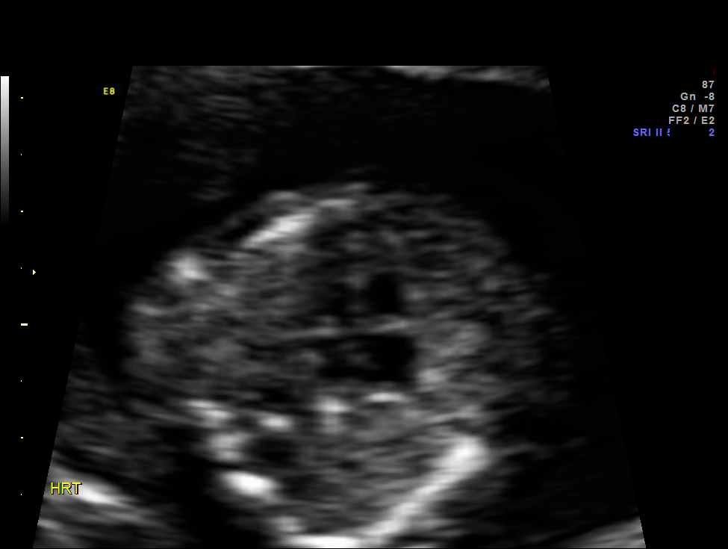
[im 4/26]
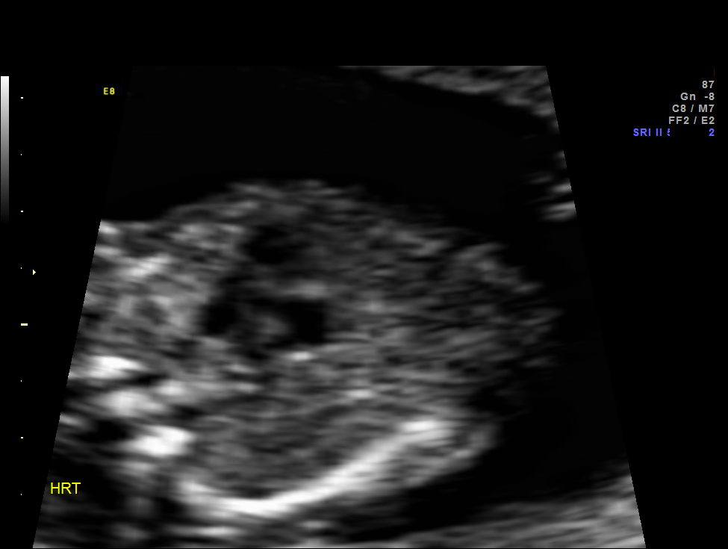
[im 6/26]
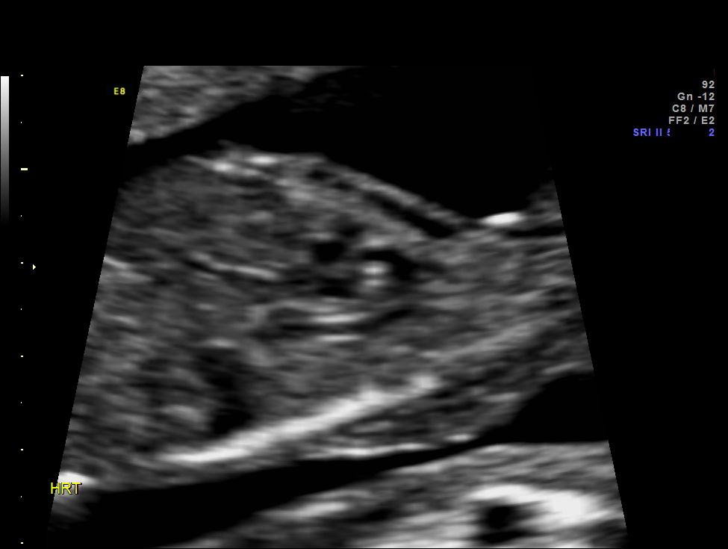
[im 8/26]
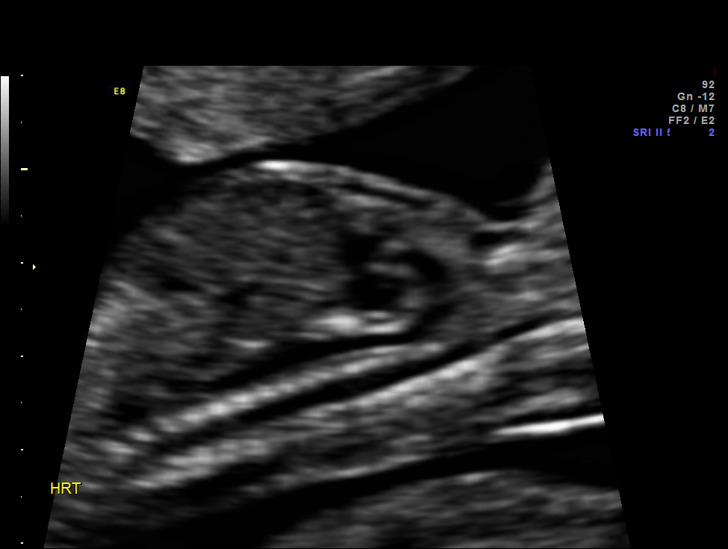
[im 10/26]
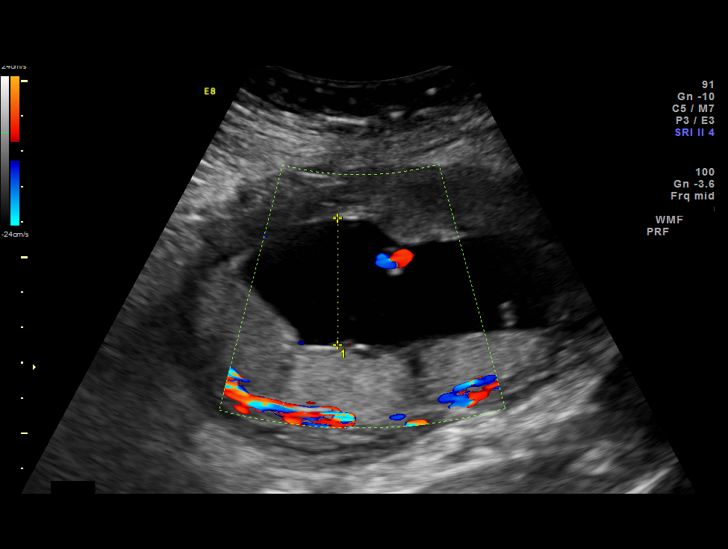
[im 12/26]
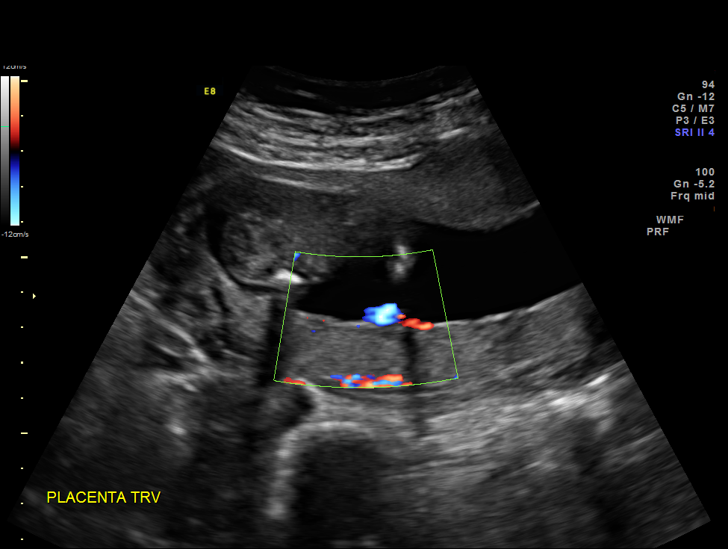
[im 14/26]
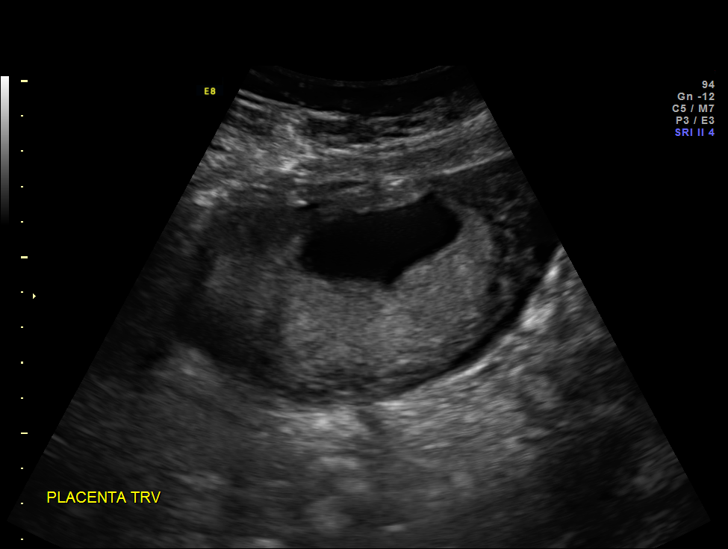
[im 16/26]
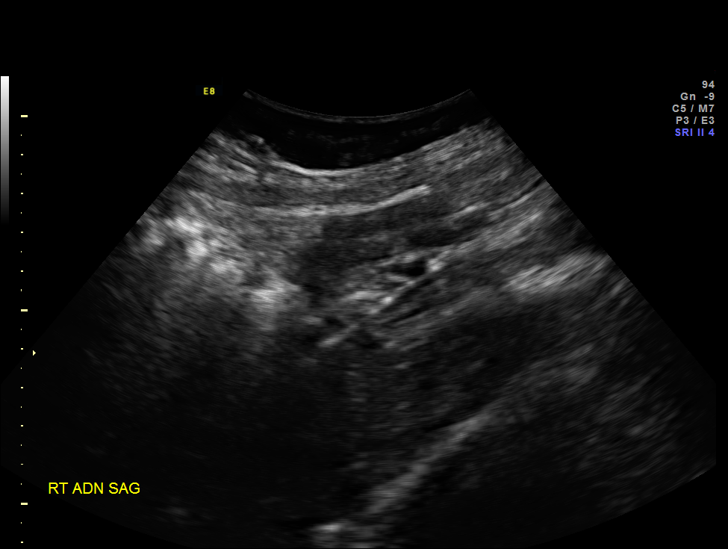
[im 18/26]
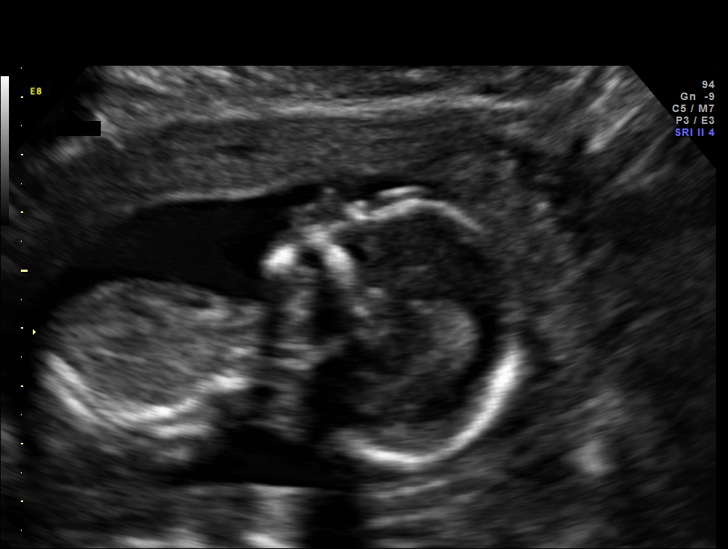
[im 20/26]
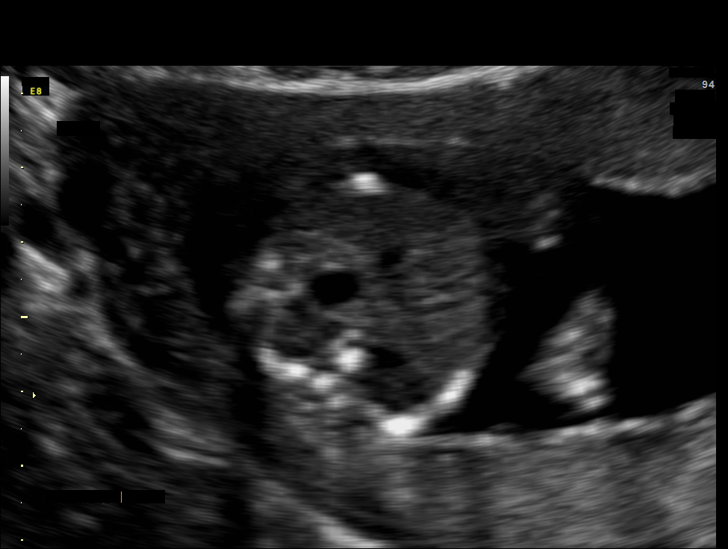
[im 22/26]
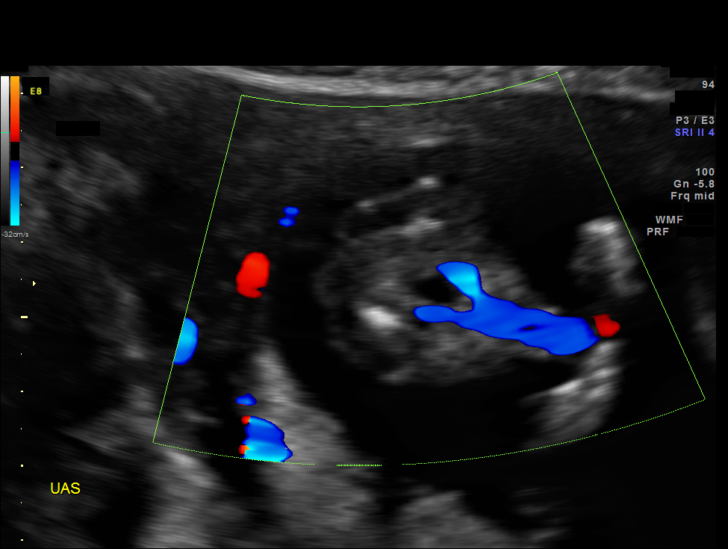
[im 24/26]
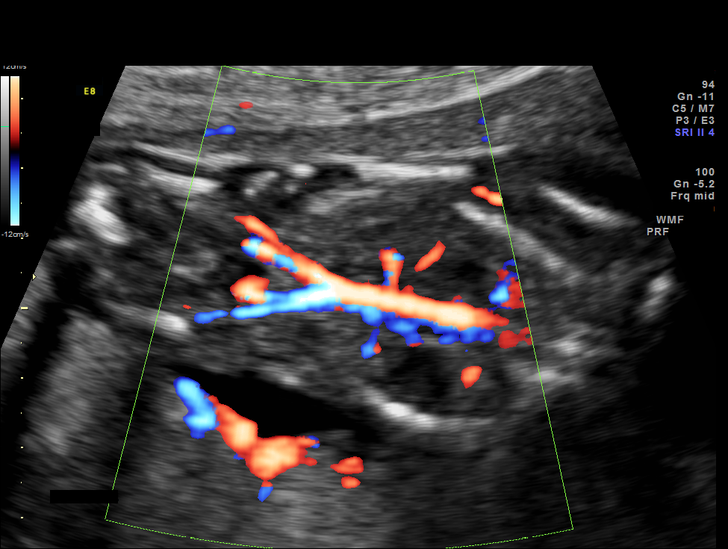
[im 26/26]
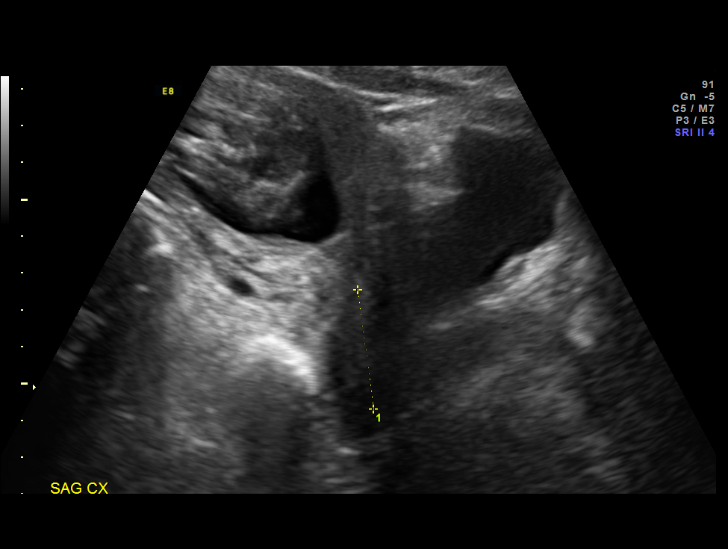

[13 of 26 positions shown; findings below may reference images not displayed]

OBSTETRICS REPORT
                      (Signed Final 10/17/2013 [DATE])

Service(s) Provided

 [HOSPITAL]                                         76815.0
Indications

 Cystic hygroma - S/P [facility] (normal karyotype)
 Hypothyroid (on synthroid)
Fetal Evaluation

 Num Of Fetuses:    1
 Fetal Heart Rate:  159                          bpm
 Cardiac Activity:  Observed
 Presentation:      Cephalic
 Placenta:          Posterior, above cervical
                    os
 P. Cord            Visualized, central
 Insertion:

 Amniotic Fluid
 AFI FV:      Subjectively within normal limits
                                             Larg Pckt:     3.5  cm
Gestational Age

 LMP:           20w 3d        Date:  05/27/13                 EDD:   03/03/14
 Best:          16w 3d     Det. By:  Early Ultrasound         EDD:   03/31/14
                                     (08/05/13)
Cervix Uterus Adnexa

 Cervical Length:    3.2      cm

 Cervix:       Normal appearance by transabdominal scan.

 Adnexa:     No abnormality visualized.
Impression

 Single living intrauterine pregnancy at 16 weeks 3 days.
 Normal amniotic fluid volume.
Recommendations

 Recommend follow-up ultrasound examination in 2 weeks for
 detailed fetal anatomic survey.
 questions or concerns.
                Jim, Davis

## 2015-03-19 ENCOUNTER — Encounter: Payer: Self-pay | Admitting: "Endocrinology

## 2015-03-19 ENCOUNTER — Ambulatory Visit (INDEPENDENT_AMBULATORY_CARE_PROVIDER_SITE_OTHER): Payer: Managed Care, Other (non HMO) | Admitting: "Endocrinology

## 2015-03-19 VITALS — BP 110/72 | HR 95 | Ht 60.0 in | Wt 180.0 lb

## 2015-03-19 DIAGNOSIS — E039 Hypothyroidism, unspecified: Secondary | ICD-10-CM

## 2015-03-19 MED ORDER — LEVOTHYROXINE SODIUM 88 MCG PO TABS: 88.0000 ug | ORAL_TABLET | Freq: Every day | ORAL | Status: DC

## 2015-03-20 NOTE — Progress Notes (Signed)
Subjective:    Patient ID: Meagan Woods, female    DOB: Jan 08, 1981, PCP Yvone Neu, MD   Past Medical History  Diagnosis Date  . Hypothyroidism   . Depression   . Headache   . Thyroiditis, autoimmune    Past Surgical History  Procedure Laterality Date  . No past surgeries     Social History   Social History  . Marital Status: Married    Spouse Name: N/A  . Number of Children: N/A  . Years of Education: N/A   Social History Main Topics  . Smoking status: Never Smoker   . Smokeless tobacco: Never Used  . Alcohol Use: No  . Drug Use: No  . Sexual Activity: Yes   Other Topics Concern  . None   Social History Narrative   Outpatient Encounter Prescriptions as of 03/19/2015  Medication Sig  . butalbital-acetaminophen-caffeine (FIORICET, ESGIC) 50-325-40 MG tablet Take 1 tablet by mouth every 4 (four) hours as needed for headache.  . ibuprofen (ADVIL,MOTRIN) 600 MG tablet Take 1 tablet (600 mg total) by mouth every 6 (six) hours.  Marland Kitchen levothyroxine (SYNTHROID, LEVOTHROID) 88 MCG tablet Take 1 tablet (88 mcg total) by mouth daily before breakfast.  . venlafaxine (EFFEXOR) 25 MG tablet Take 25 mg by mouth daily.  . [DISCONTINUED] levothyroxine (SYNTHROID, LEVOTHROID) 75 MCG tablet Take 1 tablet (75 mcg total) by mouth daily before breakfast.  . [DISCONTINUED] citalopram (CELEXA) 20 MG tablet Take 20 mg by mouth at bedtime.  . [DISCONTINUED] Prenatal Vit-Fe Fumarate-FA (PRENATAL MULTIVITAMIN) TABS tablet Take 1 tablet by mouth at bedtime.   No facility-administered encounter medications on file as of 03/19/2015.   ALLERGIES: Allergies  Allergen Reactions  . Sulfa Antibiotics Rash   VACCINATION STATUS: Immunization History  Administered Date(s) Administered  . Influenza-Unspecified 12/18/2013  . Tdap 01/14/2014    HPI Meagan Woods is a 34 yr old female with no major medical problems. She is here to follow-up for hypothyroidism. She is on levothyroxine 75  g by mouth every morning. She is compliant. She feels better, she came back complaining of sweating.  She denies fever. She is gaining weight,  has more energy.  She denies palpitations, nor menstrual cycle abnormality. she denies hx of goiter, no neck surgery. She did have postpartum thyroiditis   Review of Systems  Constitutional:  +weight gain, no fatigue, + subjective sweating. Eyes: no blurry vision, no xerophthalmia ENT: no sore throat, no nodules palpated in throat, no dysphagia/odynophagia, no hoarseness Cardiovascular: no CP/SOB/palpitations/leg swelling Respiratory: no cough/SOB Gastrointestinal: no N/V/D/C Musculoskeletal: no muscle/joint aches Skin: no rashes Neurological: no tremors/numbness/tingling/dizziness Psychiatric: no depression/anxiety  Objective:    BP 110/72 mmHg  Pulse 95  Ht 5' (1.524 m)  Wt 180 lb (81.647 kg)  BMI 35.15 kg/m2  SpO2 100%  Wt Readings from Last 3 Encounters:  03/19/15 180 lb (81.647 kg)  02/05/15 175 lb 9.6 oz (79.652 kg)  03/24/14 196 lb (88.905 kg)    Physical Exam Constitutional: overweight, in NAD Eyes: PERRLA, EOMI, no exophthalmos ENT: moist mucous membranes, no thyromegaly, no cervical lymphadenopathy Cardiovascular: RRR, No MRG Respiratory: CTA B Gastrointestinal: abdomen soft, NT, ND, BS+ Musculoskeletal: no deformities, strength intact in all 4 Skin: moist, warm, no rashes Neurological: no tremor with outstretched hands, DTR normal in all 4    On 01/29/2015 her TSH was 2.37, free T4 1 0.2, and vitamin D 45.3. On 03/17/2015 her thyroid function tests showed free T4 low normal at 0.83 associated with  TSH high normal at 2.83.   Assessment & Plan:   1. Primary hypothyroidism She is on levothyroxine clinically responding. Her thyroid function tests are improving. I will increase her dose to 88 g by mouth every morning.  - We discussed about correct intake of levothyroxine, at fasting, with water, separated by at least  30 minutes from breakfast, and separated by more than 4 hours from calcium, iron, multivitamins, acid reflux medications (PPIs). -Patient is made aware of the fact that thyroid hormone replacement is needed for life, dose to be adjusted by periodic monitoring of thyroid function tests. -I counseled her to give me a call if she confirms another pregnancy since she will need higher dose of levothyroxine during early first trimester. -The sweating she is complaining of is unlikely to be due to her thyroid dysfunction. I have ordered labs including CBC, A1c and repeat thyroid function test in 3 months. At this point I do not suspect other endocrine dysfunction for excessive sweating. If this continues to be a problem she may need more work up later on. - I advised patient to maintain close follow up with their PCP for primary care needs.  Follow up plan: Return in about 3 months (around 06/17/2015) for underactive thyroid.  Glade Lloyd, MD Phone: [phone]  Fax: [phone]   03/20/2015, 9:11 AM

## 2015-04-16 ENCOUNTER — Encounter: Payer: Self-pay | Admitting: Obstetrics and Gynecology

## 2015-07-08 ENCOUNTER — Ambulatory Visit: Payer: Managed Care, Other (non HMO) | Admitting: "Endocrinology

## 2015-07-09 ENCOUNTER — Ambulatory Visit: Payer: Managed Care, Other (non HMO) | Admitting: "Endocrinology

## 2015-07-29 ENCOUNTER — Ambulatory Visit: Payer: Managed Care, Other (non HMO) | Admitting: "Endocrinology

## 2015-07-30 ENCOUNTER — Other Ambulatory Visit: Payer: Self-pay | Admitting: "Endocrinology

## 2015-08-01 IMAGING — US US OB FOLLOW-UP
1 series · 12 of 28 positions shown · non-contrast
Comparison: none

[Series 1: us ob follow-up · 0.23mm/px · 12 of 34 slices shown]
[im 2/34]
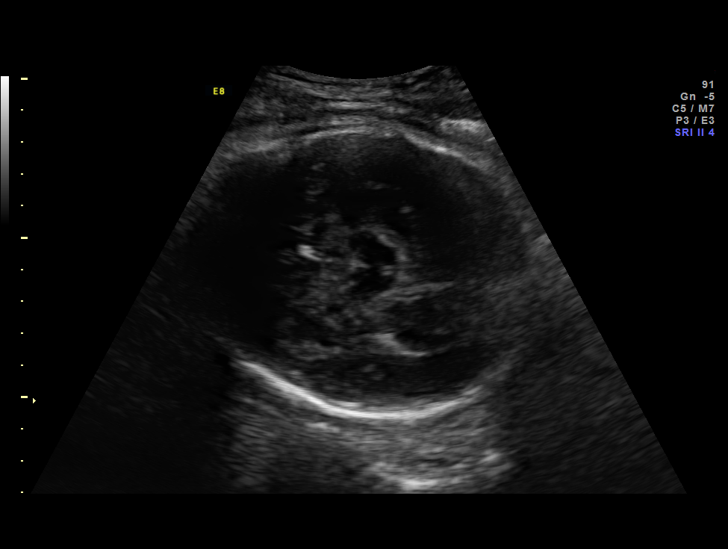
[im 4/34]
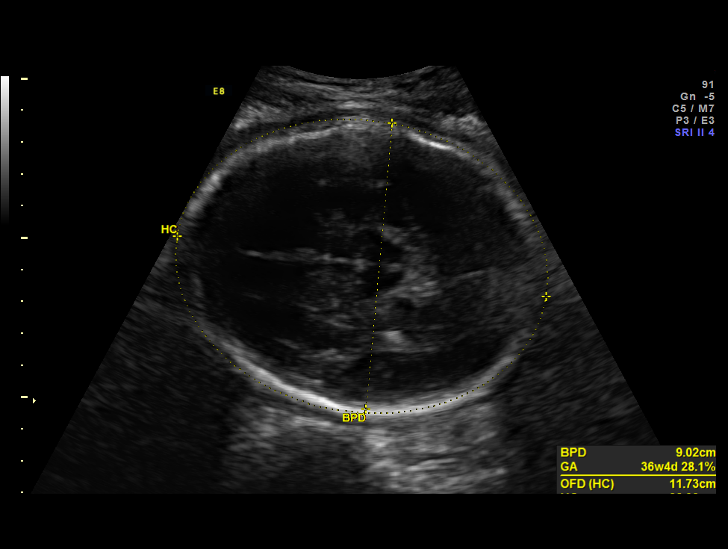
[im 7/34]
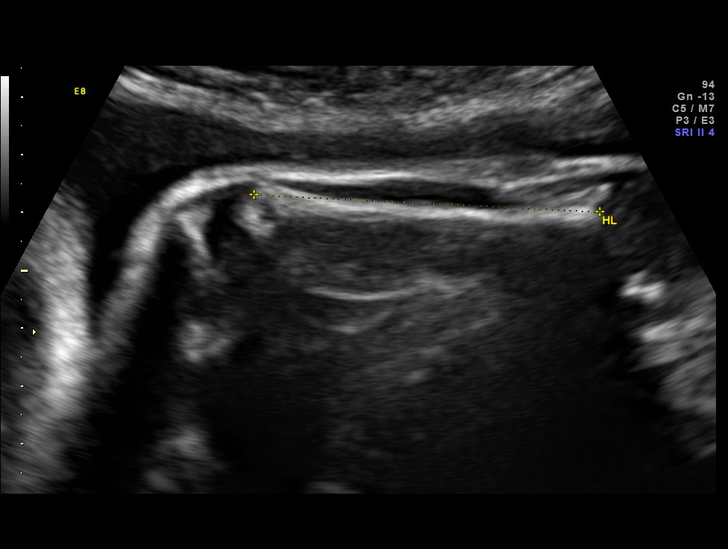
[im 10/34]
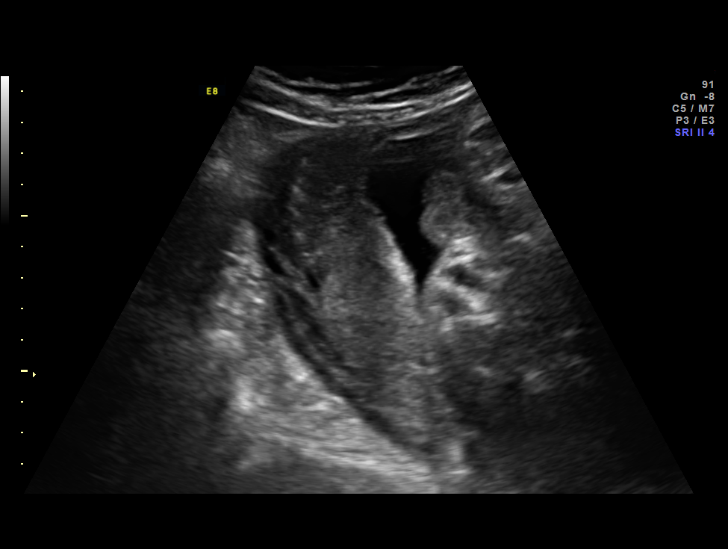
[im 13/34]
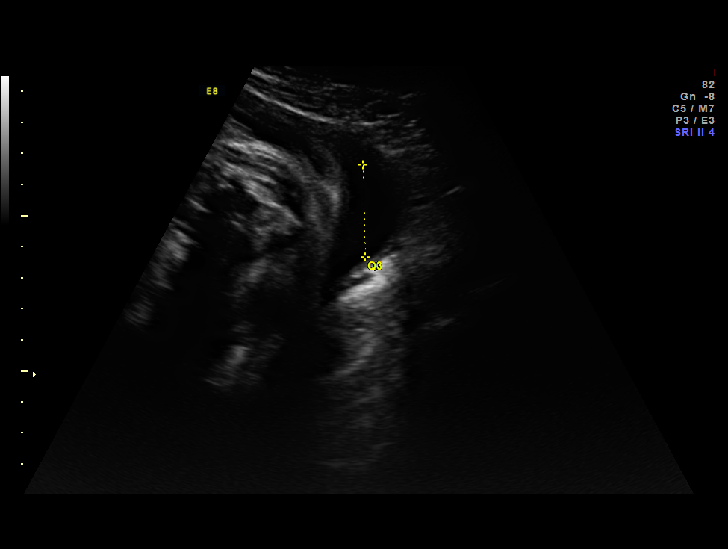
[im 15/34]
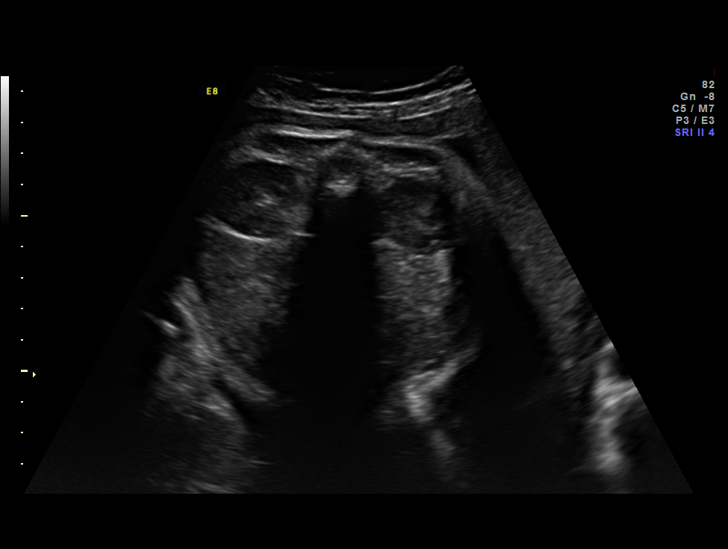
[im 19/34]
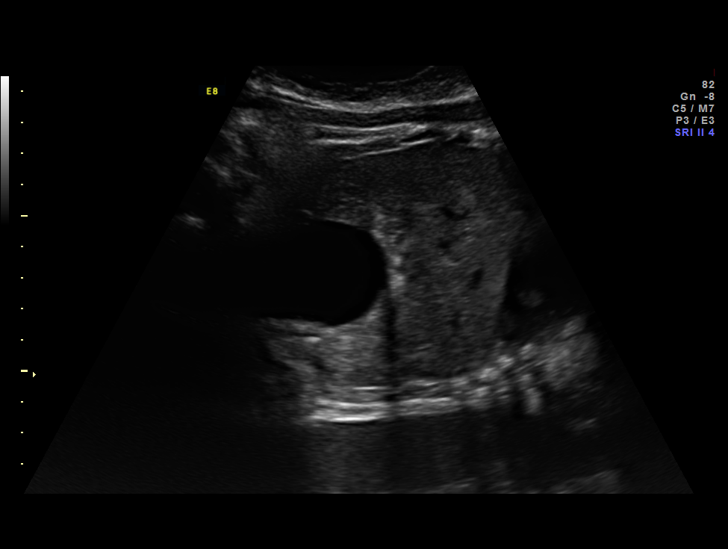
[im 21/34]
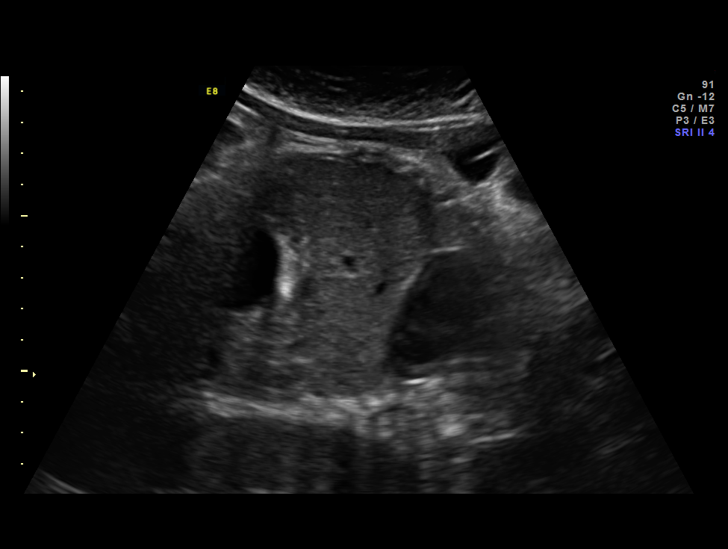
[im 24/34]
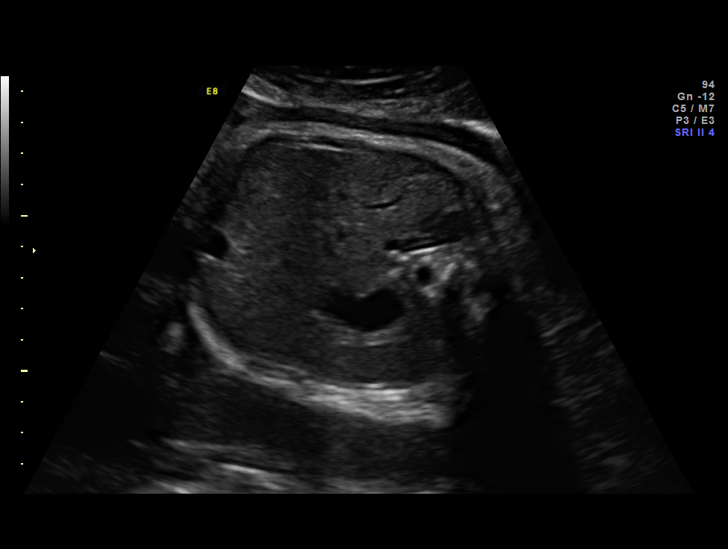
[im 27/34]
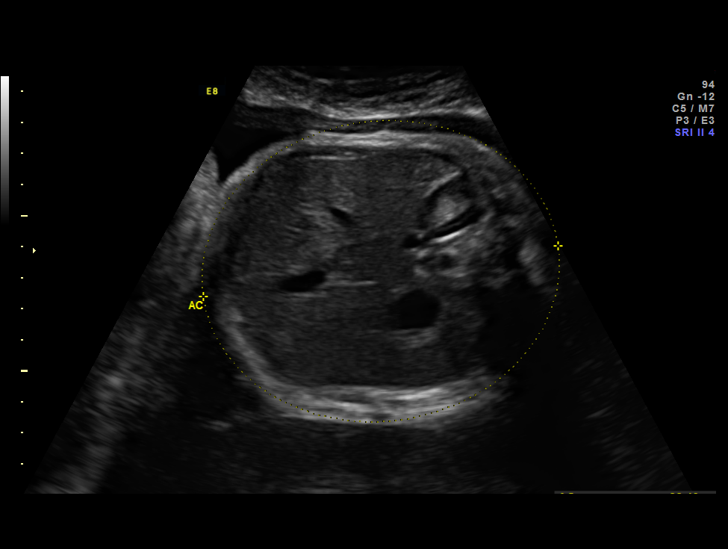
[im 30/34]
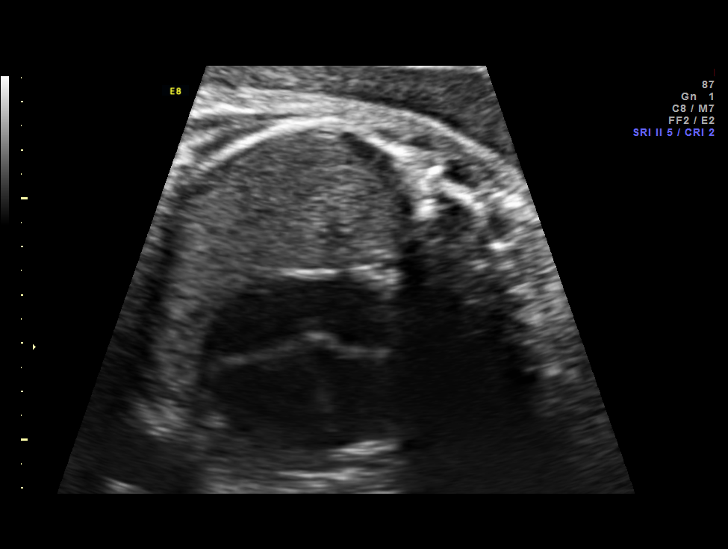
[im 32/34]
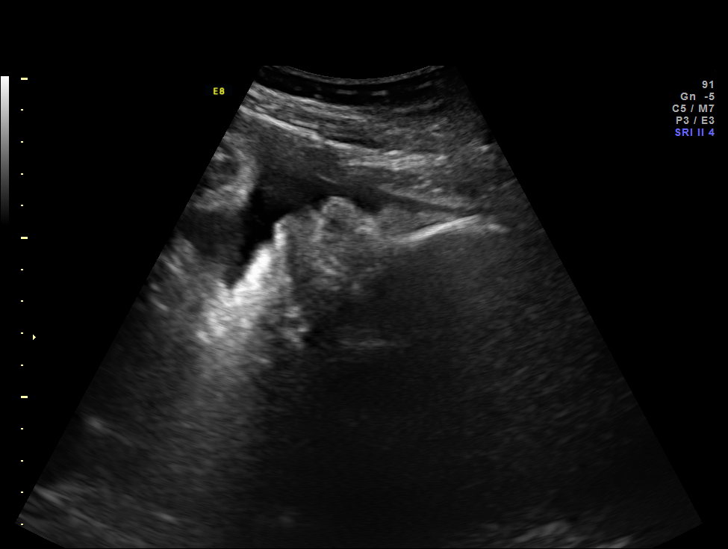

[12 of 28 positions shown; findings below may reference images not displayed]

OBSTETRICS REPORT
                      (Signed Final 03/16/2014 [DATE])

Service(s) Provided

 US OB FOLLOW UP                                       76816.1
Indications

 Polyhydramnios - resolved
 Choroid plexus cyst (resolved)
 Cystic hygroma (resolved) - [facility]P [facility], normal
 karyotype
 Fetal abnormality - other known or suspected
 (possible VSD by ECHO)
 Hypothyroid (on synthroid)
 37 weeks gestation of pregnancy
Fetal Evaluation

 Num Of Fetuses:    1
 Fetal Heart Rate:  145                          bpm
 Cardiac Activity:  Observed
 Presentation:      Cephalic
 Placenta:          Posterior Fundal, above
                    cervical os
 P. Cord            Previously Visualized
 Insertion:

 Amniotic Fluid
 AFI FV:      Subjectively within normal limits
 AFI Sum:     12.2     cm      42  %Tile      Larg Pckt:   4.32  cm
 RUQ:   4.32    cm   RLQ:    1.66   cm    LUQ:    3.24   cm    LLQ:   2.98    cm
Biometry

 BPD:     90.6   mm    G. Age:  36w 5d                CI:          78.1   70 - 86
 OFD:      116   mm                                   FL/HC:       21.2   20.9 -

 HC:     327.8   mm    G. Age:  37w 2d       18   %   HC/AC:       0.98   0.92 -

 AC:     335.9   mm    G. Age:  37w 3d       57   %   FL/BPD:      76.8   71 - 87
 FL:      69.6   mm    G. Age:  35w 5d         9  %   FL/AC:       20.7   20 - 24
 HUM:     59.9   mm    G. Age:  34w 6d       17   %
 Est. FW:    7802   gm    6 lb 12 oz     56  %
Gestational Age

 LMP:           41w 6d        Date:  05/27/13                 EDD:    03/03/14
 U/S Today:     36w 5d                                        EDD:    04/08/14
 Best:          37w 6d     Det. By:  Early Ultrasound         EDD:    03/31/14
                                     (08/05/13)
Anatomy

 Cranium:          Previously seen        Aortic Arch:       Previously seen
 Fetal Cavum:      Previously seen        Ductal Arch:       Previously seen
 Ventricles:       Previously seen        Diaphragm:         Appears normal
 Choroid Plexus:   Resolved CPC           Stomach:           Appears normal, left
                                                             sided
 Cerebellum:       Previously seen        Abdomen:           Previously seen
 Posterior Fossa:  Previously seen        Abdominal Wall:    Previously seen
 Nuchal Fold:      Previously seen        Cord Vessels:      Previously seen
 Face:             Orbits and profile     Kidneys:           Appear normal
                   previously seen
 Lips:             Previously seen        Bladder:           Appears normal
 Heart:            Appears normal         Spine:             Previously seen
                   (4CH, axis, and
                   situs)
 RVOT:             Previously seen        Lower              Previously seen
                                          Extremities:
 LVOT:             Previously seen        Upper              Previously seen
                                          Extremities:

 Other:  Male gender. Heels and 5th digit previously seen.
Cervix Uterus Adnexa

 Cervix:       Not visualized (advanced GA >39wks)
Impression

 SIUP at 37+6 weeks
 Normal interval anatomy; anatomic survey complete
 Normal amniotic fluid volume
 Appropriate interval growth with EFW at the 56th %tile
Recommendations

 Follow-up as clinically indicated

 questions or concerns.

## 2015-08-12 ENCOUNTER — Ambulatory Visit (INDEPENDENT_AMBULATORY_CARE_PROVIDER_SITE_OTHER): Payer: Managed Care, Other (non HMO) | Admitting: "Endocrinology

## 2015-08-12 ENCOUNTER — Encounter: Payer: Self-pay | Admitting: "Endocrinology

## 2015-08-12 VITALS — BP 128/84 | HR 94 | Ht 60.0 in | Wt 176.0 lb

## 2015-08-12 DIAGNOSIS — E039 Hypothyroidism, unspecified: Secondary | ICD-10-CM

## 2015-08-12 MED ORDER — LEVOTHYROXINE SODIUM 88 MCG PO TABS: ORAL_TABLET | ORAL | Status: DC

## 2015-08-12 NOTE — Progress Notes (Signed)
Subjective:    Patient ID: Meagan Woods, female    DOB: 09-Mar-1981, PCP Yvone Neu, MD   Past Medical History  Diagnosis Date  . Hypothyroidism   . Depression   . Headache   . Thyroiditis, autoimmune    Past Surgical History  Procedure Laterality Date  . No past surgeries     Social History   Social History  . Marital Status: Married    Spouse Name: N/A  . Number of Children: N/A  . Years of Education: N/A   Social History Main Topics  . Smoking status: Never Smoker   . Smokeless tobacco: Never Used  . Alcohol Use: No  . Drug Use: No  . Sexual Activity: Yes   Other Topics Concern  . None   Social History Narrative   Outpatient Encounter Prescriptions as of 08/12/2015  Medication Sig  . venlafaxine XR (EFFEXOR-XR) 150 MG 24 hr capsule Take 150 mg by mouth daily with breakfast.  . butalbital-acetaminophen-caffeine (FIORICET, ESGIC) 50-325-40 MG tablet Take 1 tablet by mouth every 4 (four) hours as needed for headache.  . ibuprofen (ADVIL,MOTRIN) 600 MG tablet Take 1 tablet (600 mg total) by mouth every 6 (six) hours.  Marland Kitchen levothyroxine (SYNTHROID, LEVOTHROID) 88 MCG tablet TAKE 1 TABLET BY MOUTH DAILY BEFORE BREAKFAST.  . [DISCONTINUED] levothyroxine (SYNTHROID, LEVOTHROID) 88 MCG tablet TAKE 1 TABLET BY MOUTH DAILY BEFORE BREAKFAST.  . [DISCONTINUED] venlafaxine (EFFEXOR) 25 MG tablet Take 25 mg by mouth daily.   No facility-administered encounter medications on file as of 08/12/2015.   ALLERGIES: Allergies  Allergen Reactions  . Sulfa Antibiotics Rash   VACCINATION STATUS: Immunization History  Administered Date(s) Administered  . Influenza-Unspecified 12/18/2013  . Tdap 01/14/2014    HPI Meagan Woods is a 34 yr old female with  medical  Hx as above. She is here to follow-up for hypothyroidism. She is on levothyroxine 88 g by mouth every morning. She is compliant. She feels better. She denies fever. She is losing  weight,  has more energy.  She denies palpitations, nor menstrual cycle abnormality. she denies hx of goiter, no neck surgery. She did have postpartum thyroiditis   Review of Systems  Constitutional:  +weight loss, no fatigue, -subjective sweating. Eyes: no blurry vision, no xerophthalmia ENT: no sore throat, no nodules palpated in throat, no dysphagia/odynophagia, no hoarseness Cardiovascular: no CP/SOB/palpitations/leg swelling Respiratory: no cough/SOB Gastrointestinal: no N/V/D/C Musculoskeletal: no muscle/joint aches Skin: no rashes Neurological: no tremors/numbness/tingling/dizziness Psychiatric: no depression/anxiety  Objective:    BP 128/84 mmHg  Pulse 94  Ht 5' (1.524 m)  Wt 176 lb (79.833 kg)  BMI 34.37 kg/m2  Wt Readings from Last 3 Encounters:  08/12/15 176 lb (79.833 kg)  03/19/15 180 lb (81.647 kg)  02/05/15 175 lb 9.6 oz (79.652 kg)    Physical Exam Constitutional: overweight, in NAD Eyes: PERRLA, EOMI, no exophthalmos ENT: moist mucous membranes, no thyromegaly, no cervical lymphadenopathy Cardiovascular: RRR, No MRG Respiratory: CTA B Gastrointestinal: abdomen soft, NT, ND, BS+ Musculoskeletal: no deformities, strength intact in all 4 Skin: moist, warm, no rashes Neurological: no tremor with outstretched hands, DTR normal in all 4   Labs : 07/21/2015 TSH 0.89, free t4 1.18 a1c 5.3%  Complete labs to be scanned into her records.   Assessment & Plan:   1. Primary hypothyroidism She is on levothyroxine clinically responding. Her thyroid function tests are improving. I will continue LT4 88 g by mouth every morning.  - We discussed about correct intake  of levothyroxine, at fasting, with water, separated by at least 30 minutes from breakfast, and separated by more than 4 hours from calcium, iron, multivitamins, acid reflux medications (PPIs). -Patient is made aware of the fact that thyroid hormone replacement is needed for life, dose to be adjusted by periodic monitoring of  thyroid function tests. -I counseled her to give me a call if she confirms another pregnancy since she will need higher dose of levothyroxine during early first trimester. - Her A1c is 5.3% indicating absence of prediabetes/diabetes. -I advised her to decrease her vitamin D intake to monthly for this summer season, will repeat vitamin D level along with her next labs. - I advised patient to maintain close follow up with their PCP for primary care needs.  Follow up plan: Return in about 6 months (around 02/12/2016) for follow up with pre-visit labs.  Glade Lloyd, MD Phone: [phone]  Fax: [phone]   08/12/2015, 11:41 AM

## 2015-08-24 ENCOUNTER — Encounter: Payer: Self-pay | Admitting: "Endocrinology

## 2015-09-16 ENCOUNTER — Telehealth: Payer: Self-pay | Admitting: "Endocrinology

## 2015-09-16 MED ORDER — VITAMIN D (ERGOCALCIFEROL) 1.25 MG (50000 UNIT) PO CAPS: 50000.0000 [IU] | ORAL_CAPSULE | ORAL | Status: DC

## 2015-09-16 NOTE — Telephone Encounter (Signed)
needs vitamin D called in - when she was here she had enough but needs some now

## 2016-02-03 ENCOUNTER — Encounter: Payer: Self-pay | Admitting: "Endocrinology

## 2016-02-09 ENCOUNTER — Ambulatory Visit (INDEPENDENT_AMBULATORY_CARE_PROVIDER_SITE_OTHER): Payer: Managed Care, Other (non HMO) | Admitting: "Endocrinology

## 2016-02-09 ENCOUNTER — Ambulatory Visit: Payer: Managed Care, Other (non HMO) | Admitting: "Endocrinology

## 2016-02-09 ENCOUNTER — Encounter: Payer: Self-pay | Admitting: "Endocrinology

## 2016-02-09 VITALS — BP 122/78 | HR 97 | Ht 60.0 in | Wt 177.0 lb

## 2016-02-09 DIAGNOSIS — E039 Hypothyroidism, unspecified: Secondary | ICD-10-CM

## 2016-02-09 DIAGNOSIS — E559 Vitamin D deficiency, unspecified: Secondary | ICD-10-CM | POA: Diagnosis not present

## 2016-02-09 MED ORDER — VITAMIN D (ERGOCALCIFEROL) 1.25 MG (50000 UNIT) PO CAPS: 50000.0000 [IU] | ORAL_CAPSULE | ORAL | 6 refills | Status: DC

## 2016-02-09 MED ORDER — LEVOTHYROXINE SODIUM 100 MCG PO TABS: ORAL_TABLET | ORAL | 6 refills | Status: DC

## 2016-02-09 NOTE — Progress Notes (Signed)
Subjective:    Patient ID: Meagan Woods, female    DOB: [birth year], PCP Meagan Neu, MD   Past Medical History:  Diagnosis Date  . Depression   . Headache   . Hypothyroidism   . Thyroiditis, autoimmune    Past Surgical History:  Procedure Laterality Date  . NO PAST SURGERIES     Social History   Social History  . Marital status: Married    Spouse name: N/A  . Number of children: N/A  . Years of education: N/A   Social History Main Topics  . Smoking status: Never Smoker  . Smokeless tobacco: Never Used  . Alcohol use No  . Drug use: No  . Sexual activity: Yes   Other Topics Concern  . None   Social History Narrative  . None   Outpatient Encounter Prescriptions as of 02/09/2016  Medication Sig  . butalbital-acetaminophen-caffeine (FIORICET, ESGIC) 50-325-40 MG tablet Take 1 tablet by mouth every 4 (four) hours as needed for headache.  . ibuprofen (ADVIL,MOTRIN) 600 MG tablet Take 1 tablet (600 mg total) by mouth every 6 (six) hours.  Marland Kitchen levothyroxine (SYNTHROID, LEVOTHROID) 100 MCG tablet TAKE 1 TABLET BY MOUTH DAILY BEFORE BREAKFAST.  Marland Kitchen venlafaxine XR (EFFEXOR-XR) 150 MG 24 hr capsule Take 150 mg by mouth daily with breakfast.  . Vitamin D, Ergocalciferol, (DRISDOL) 50000 units CAPS capsule Take 1 capsule (50,000 Units total) by mouth every 7 (seven) days.  . [DISCONTINUED] levothyroxine (SYNTHROID, LEVOTHROID) 88 MCG tablet TAKE 1 TABLET BY MOUTH DAILY BEFORE BREAKFAST.  . [DISCONTINUED] Vitamin D, Ergocalciferol, (DRISDOL) 50000 units CAPS capsule Take 1 capsule (50,000 Units total) by mouth every 7 (seven) days.   No facility-administered encounter medications on file as of 02/09/2016.    ALLERGIES: Allergies  Allergen Reactions  . Sulfa Antibiotics Rash   VACCINATION STATUS: Immunization History  Administered Date(s) Administered  . Influenza-Unspecified 12/18/2013  . Tdap 01/14/2014    HPI [name] is a 35 yr old female with  medical   Hx as above. She is here to follow-up for hypothyroidism. She is on levothyroxine 88 g by mouth every morning. She is compliant. She feels better. She denies fever. She has a steady weight,  has more energy.  She denies palpitations, nor menstrual cycle abnormality. she denies hx of goiter, no neck surgery. She did have postpartum thyroiditis   Review of Systems  Constitutional:   + Steady weight , no fatigue, -subjective sweating. Eyes: no blurry vision, no xerophthalmia ENT: no sore throat, no nodules palpated in throat, no dysphagia/odynophagia, no hoarseness Cardiovascular: no CP/SOB/palpitations/leg swelling Respiratory: no cough/SOB Gastrointestinal: no N/V/D/C Musculoskeletal: no muscle/joint aches Skin: no rashes Neurological: no tremors/numbness/tingling/dizziness Psychiatric: no depression/anxiety  Objective:    BP 122/78   Pulse 97   Ht 5' (1.524 m)   Wt 177 lb (80.3 kg)   BMI 34.57 kg/m   Wt Readings from Last 3 Encounters:  02/09/16 177 lb (80.3 kg)  08/12/15 176 lb (79.8 kg)  03/19/15 180 lb (81.6 kg)    Physical Exam Constitutional: overweight, in NAD Eyes: PERRLA, EOMI, no exophthalmos ENT: moist mucous membranes, no thyromegaly, no cervical lymphadenopathy Cardiovascular: RRR, No MRG Respiratory: CTA B Gastrointestinal: abdomen soft, NT, ND, BS+ Musculoskeletal: no deformities, strength intact in all 4 Skin: moist, warm, no rashes Neurological: no tremor with outstretched hands, DTR normal in all 4  On 02/03/2016 her free T4 1.07, TSH 1.69, vitamin D 26   Labs : 07/21/2015 TSH 0.89, free  t4 1.18 a1c 5.3%    Assessment & Plan:   1. Primary hypothyroidism She is on levothyroxine clinically responding. Her thyroid function tests are improving, But would benefit from a slight increase in her levothyroxine. I will prescribe levothyroxine 100 g by mouth every morning.   - We discussed about correct intake of levothyroxine, at fasting, with water,  separated by at least 30 minutes from breakfast, and separated by more than 4 hours from calcium, iron, multivitamins, acid reflux medications (PPIs). -Patient is made aware of the fact that thyroid hormone replacement is needed for life, dose to be adjusted by periodic monitoring of thyroid function tests. -I counseled her to give me a call if she confirms another pregnancy since she will need higher dose of levothyroxine during early first trimester. - Her  Last A1c is 5.3% indicating absence of prediabetes/diabetes. -I advised her to increase her vitamin D   50,000 units weekly . - I advised patient to maintain close follow up with their PCP for primary care needs.  Follow up plan: Return in about 6 months (around 08/08/2016) for follow up with pre-visit labs.  Meagan Lloyd, MD Phone: [phone]  Fax: [phone]   02/09/2016, 1:35 PM

## 2016-02-14 ENCOUNTER — Ambulatory Visit: Payer: Managed Care, Other (non HMO) | Admitting: "Endocrinology

## 2016-08-10 ENCOUNTER — Ambulatory Visit: Payer: Managed Care, Other (non HMO) | Admitting: "Endocrinology

## 2016-09-04 ENCOUNTER — Ambulatory Visit: Payer: Managed Care, Other (non HMO) | Admitting: "Endocrinology

## 2016-09-07 ENCOUNTER — Other Ambulatory Visit: Payer: Self-pay | Admitting: "Endocrinology

## 2016-09-22 LAB — TSH: TSH: 0.97 (ref <=5.90)

## 2016-09-26 ENCOUNTER — Encounter: Payer: Self-pay | Admitting: "Endocrinology

## 2016-09-26 ENCOUNTER — Ambulatory Visit (INDEPENDENT_AMBULATORY_CARE_PROVIDER_SITE_OTHER): Payer: Managed Care, Other (non HMO) | Admitting: "Endocrinology

## 2016-09-26 VITALS — BP 118/81 | HR 99 | Ht 60.0 in | Wt 188.0 lb

## 2016-09-26 DIAGNOSIS — E039 Hypothyroidism, unspecified: Secondary | ICD-10-CM

## 2016-09-26 MED ORDER — LEVOTHYROXINE SODIUM 125 MCG PO TABS: ORAL_TABLET | ORAL | 6 refills | Status: DC

## 2016-09-26 NOTE — Progress Notes (Signed)
Subjective:    Patient ID: Meagan Woods, female    DOB: 16-Dec-1980, PCP Hungarland, Jenetta Downer, MD   Past Medical History:  Diagnosis Date  . Depression   . Headache   . Hypothyroidism   . Thyroiditis, autoimmune    Past Surgical History:  Procedure Laterality Date  . NO PAST SURGERIES     Social History   Social History  . Marital status: Married    Spouse name: N/A  . Number of children: N/A  . Years of education: N/A   Social History Main Topics  . Smoking status: Never Smoker  . Smokeless tobacco: Never Used  . Alcohol use No  . Drug use: No  . Sexual activity: Yes   Other Topics Concern  . None   Social History Narrative  . None   Outpatient Encounter Prescriptions as of 09/26/2016  Medication Sig  . butalbital-acetaminophen-caffeine (FIORICET, ESGIC) 50-325-40 MG tablet Take 1 tablet by mouth every 4 (four) hours as needed for headache.  . ibuprofen (ADVIL,MOTRIN) 600 MG tablet Take 1 tablet (600 mg total) by mouth every 6 (six) hours.  Marland Kitchen levothyroxine (SYNTHROID, LEVOTHROID) 125 MCG tablet TAKE 1 TABLET BY MOUTH ONCE DAILY BEFORE BREAKFAST.  Marland Kitchen venlafaxine XR (EFFEXOR-XR) 150 MG 24 hr capsule Take 150 mg by mouth daily with breakfast.  . [DISCONTINUED] levothyroxine (SYNTHROID, LEVOTHROID) 100 MCG tablet TAKE 1 TABLET BY MOUTH ONCE DAILY BEFORE BREAKFAST.  . [DISCONTINUED] Vitamin D, Ergocalciferol, (DRISDOL) 50000 units CAPS capsule Take 1 capsule (50,000 Units total) by mouth every 7 (seven) days.   No facility-administered encounter medications on file as of 09/26/2016.    ALLERGIES: Allergies  Allergen Reactions  . Sulfa Antibiotics Rash   VACCINATION STATUS: Immunization History  Administered Date(s) Administered  . Influenza-Unspecified 12/18/2013  . Tdap 01/14/2014    HPI Meagan Woods is a 36 yr old female with  medical  Hx as above. She is here to follow-up for hypothyroidism. She is on levothyroxine 100 g by mouth every morning. She is  compliant. She feels better. She denies fever. She has a steady weight,  has more energy.  She denies palpitations, nor menstrual cycle abnormality. she denies hx of goiter, no neck surgery. She did have postpartum thyroiditis   Review of Systems  Constitutional:   + weight gaint , no fatigue, -subjective sweating. Eyes: no blurry vision, no xerophthalmia ENT: no sore throat, no nodules palpated in throat, no dysphagia/odynophagia, no hoarseness Cardiovascular: no CP/SOB/palpitations/leg swelling Respiratory: no cough/SOB Gastrointestinal: no N/V/D/C Musculoskeletal: no muscle/joint aches Skin: no rashes Neurological: no tremors/numbness/tingling/dizziness Psychiatric: no depression/anxiety  Objective:    BP 118/81   Pulse 99   Ht 5' (1.524 m)   Wt 188 lb (85.3 kg)   BMI 36.72 kg/m   Wt Readings from Last 3 Encounters:  09/26/16 188 lb (85.3 kg)  02/09/16 177 lb (80.3 kg)  08/12/15 176 lb (79.8 kg)    Physical Exam Constitutional: obese, in NAD Eyes: PERRLA, EOMI, no exophthalmos ENT: moist mucous membranes, no thyromegaly, no cervical lymphadenopathy Cardiovascular: RRR, No MRG Respiratory: CTA B Gastrointestinal: abdomen soft, NT, ND, BS+ Musculoskeletal: no deformities, strength intact in all 4 Skin: moist, warm, no rashes Neurological: no tremor with outstretched hands, DTR normal in all 4  On 02/03/2016 her free T4 1.07, TSH 1.69, vitamin D 26   Labs : 07/21/2015 TSH 0.89, free t4 1.18 a1c 5.3%  2018 labs showed TSH 0.97, free T4 1.07  Assessment & Plan:  1. Primary hypothyroidism She is on levothyroxine clinically responding. Her thyroid function tests are improving, But would benefit from a slight increase in her levothyroxine. I will prescribe levothyroxine 125 g by mouth every morning.   - We discussed about correct intake of levothyroxine, at fasting, with water, separated by at least 30 minutes from breakfast, and separated by more than 4 hours  from calcium, iron, multivitamins, acid reflux medications (PPIs). -Patient is made aware of the fact that thyroid hormone replacement is needed for life, dose to be adjusted by periodic monitoring of thyroid function tests. -I counseled her to give me a call if she confirms another pregnancy since she will need higher dose of levothyroxine during early first trimester. - Her  Last A1c is 5.3% indicating absence of prediabetes/diabetes. -I advised her to finish her vitamin D by taking monthly and discontinue. I will include vitamin 25-hydroxy vitamin D as part of her next blood work. - I advised patient to maintain close follow up with their PCP for primary care needs.  Follow up plan: Return in about 4 months (around 01/27/2017).  Glade Lloyd, MD Phone: [phone]  Fax: [phone]   09/26/2016, 3:50 PM

## 2016-10-02 ENCOUNTER — Other Ambulatory Visit: Payer: Self-pay | Admitting: "Endocrinology

## 2017-01-29 ENCOUNTER — Ambulatory Visit: Payer: Managed Care, Other (non HMO) | Admitting: "Endocrinology

## 2017-02-07 ENCOUNTER — Ambulatory Visit: Payer: Managed Care, Other (non HMO) | Admitting: "Endocrinology

## 2017-03-22 ENCOUNTER — Ambulatory Visit: Payer: Managed Care, Other (non HMO) | Admitting: "Endocrinology

## 2017-04-10 LAB — VITAMIN D 25 HYDROXY (VIT D DEFICIENCY, FRACTURES): Vit D, 25-Hydroxy: 36

## 2017-04-10 LAB — TSH: TSH: 0.01 — AB (ref 0.41–5.90)

## 2017-04-13 ENCOUNTER — Encounter: Payer: Self-pay | Admitting: "Endocrinology

## 2017-04-13 ENCOUNTER — Ambulatory Visit (INDEPENDENT_AMBULATORY_CARE_PROVIDER_SITE_OTHER): Payer: Managed Care, Other (non HMO) | Admitting: "Endocrinology

## 2017-04-13 VITALS — BP 118/80 | HR 90 | Ht 60.0 in | Wt 197.0 lb

## 2017-04-13 DIAGNOSIS — E559 Vitamin D deficiency, unspecified: Secondary | ICD-10-CM | POA: Diagnosis not present

## 2017-04-13 DIAGNOSIS — E039 Hypothyroidism, unspecified: Secondary | ICD-10-CM | POA: Diagnosis not present

## 2017-04-13 MED ORDER — LEVOTHYROXINE SODIUM 112 MCG PO TABS: ORAL_TABLET | ORAL | 3 refills | Status: DC

## 2017-04-13 NOTE — Progress Notes (Signed)
Subjective:    Patient ID: Meagan Woods, female    DOB: 1980/06/14, PCP Hungarland, Jenetta Downer, MD   Past Medical History:  Diagnosis Date  . Depression   . Headache   . Hypothyroidism   . Thyroiditis, autoimmune    Past Surgical History:  Procedure Laterality Date  . NO PAST SURGERIES     Social History   Socioeconomic History  . Marital status: Married    Spouse name: None  . Number of children: None  . Years of education: None  . Highest education level: None  Social Needs  . Financial resource strain: None  . Food insecurity - worry: None  . Food insecurity - inability: None  . Transportation needs - medical: None  . Transportation needs - non-medical: None  Occupational History  . None  Tobacco Use  . Smoking status: Never Smoker  . Smokeless tobacco: Never Used  Substance and Sexual Activity  . Alcohol use: No  . Drug use: No  . Sexual activity: Yes  Other Topics Concern  . None  Social History Narrative  . None   Outpatient Encounter Medications as of 04/13/2017  Medication Sig  . butalbital-acetaminophen-caffeine (FIORICET, ESGIC) 50-325-40 MG tablet Take 1 tablet by mouth every 4 (four) hours as needed for headache.  . ibuprofen (ADVIL,MOTRIN) 600 MG tablet Take 1 tablet (600 mg total) by mouth every 6 (six) hours.  Marland Kitchen levothyroxine (SYNTHROID, LEVOTHROID) 112 MCG tablet TAKE 1 TABLET BY MOUTH ONCE DAILY BEFORE BREAKFAST.  Marland Kitchen venlafaxine XR (EFFEXOR-XR) 150 MG 24 hr capsule Take 150 mg by mouth daily with breakfast.  . [DISCONTINUED] levothyroxine (SYNTHROID, LEVOTHROID) 125 MCG tablet TAKE 1 TABLET BY MOUTH ONCE DAILY BEFORE BREAKFAST.  . [DISCONTINUED] Vitamin D, Ergocalciferol, (DRISDOL) 50000 units CAPS capsule TAKE 1 CAPSULE BY MOUTH EVERY 7 DAYS.   No facility-administered encounter medications on file as of 04/13/2017.    ALLERGIES: Allergies  Allergen Reactions  . Sulfa Antibiotics Rash   VACCINATION STATUS: Immunization History   Administered Date(s) Administered  . Influenza-Unspecified 12/18/2013  . Tdap 01/14/2014    HPI Meagan Woods is a 37 yr old female with  medical  Hx as above. She is here to follow-up for hypothyroidism. She is on levothyroxine 125 g by mouth every morning.  She is compliant, she feels better. She has gained some weight. She denies palpitations, sweating, tremors.  She denies fever.   she denies hx of goiter, no neck surgery. She did have postpartum thyroiditis   Review of Systems  Constitutional:   + Progressive Weight gain of 20 pounds in 2 years , no fatigue, -subjective sweating. Eyes: no blurry vision, no xerophthalmia ENT: no sore throat, no nodules palpated in throat, no dysphagia/odynophagia, no hoarseness Cardiovascular: No chest pain, no shortness , no palpitations.  Respiratory: no cough/SOB Gastrointestinal: no N/V/D/C Musculoskeletal: no muscle/joint aches Skin: no rashes Neurological: No tremors, no dizziness, no thinking.    Psychiatric: no depression/anxiety  Objective:    BP 118/80   Pulse 90   Ht 5' (1.524 m)   Wt 197 lb (89.4 kg)   BMI 38.47 kg/m   Wt Readings from Last 3 Encounters:  04/13/17 197 lb (89.4 kg)  09/26/16 188 lb (85.3 kg)  02/09/16 177 lb (80.3 kg)    Physical Exam Constitutional:  obese, not in acute distress. Eyes: PERRLA, EOMI, no exophthalmos ENT:   moist mucous membranes, no thyromegaly, no cervical adenopathy.   Cardiovascular: RRR, No MRG Respiratory: CTA B  Gastrointestinal: abdomen soft, NT, ND, BS+ Musculoskeletal: no deformities, strength intact in all 4 Skin: moist, warm, no rashes Neurological:   no tremors of outstretched hands .  Labs from 04/10/2017: Free T4 elevated what that 88, TSH suppressed at 0.01 vitamin D 36   On 02/03/2016 her free T4 1.07, TSH 1.69, vitamin D 26   Labs : 07/21/2015 TSH 0.89, free t4 1.18 a1c 5.3%  2018 labs showed TSH 0.97, free T4 1.07  Assessment & Plan:   1. Primary  hypothyroidism She is on levothyroxine clinically responding. - Her most recent thyroid function tests are consistent with over replacement with levothyroxine.  - I discussed and lowered her levothyroxine to 112 g by mouth every morning.    - We discussed about correct intake of levothyroxine, at fasting, with water, separated by at least 30 minutes from breakfast, and separated by more than 4 hours from calcium, iron, multivitamins, acid reflux medications (PPIs). -Patient is made aware of the fact that thyroid hormone replacement is needed for life, dose to be adjusted by periodic monitoring of thyroid function tests.  -I counseled her to give me a call if she confirms another pregnancy since she will need higher dose of levothyroxine during early first trimester. - Her  Last A1c is 5.3% indicating absence of prediabetes/diabetes. -I advised her to finish her vitamin D by taking monthly and discontinue. I will include vitamin 25-hydroxy vitamin D as part of her next blood work. - I advised patient to maintain close follow up with their PCP for primary care needs.  Follow up plan: Return in about 3 months (around 07/12/2017) for follow up with pre-visit labs.  Glade Lloyd, MD Phone: [phone]  Fax: [phone]   04/13/2017, 1:12 PM

## 2017-07-13 ENCOUNTER — Ambulatory Visit: Payer: Managed Care, Other (non HMO) | Admitting: "Endocrinology

## 2017-08-16 ENCOUNTER — Encounter: Payer: Self-pay | Admitting: "Endocrinology

## 2017-08-21 ENCOUNTER — Ambulatory Visit (INDEPENDENT_AMBULATORY_CARE_PROVIDER_SITE_OTHER): Payer: Managed Care, Other (non HMO) | Admitting: "Endocrinology

## 2017-08-21 ENCOUNTER — Encounter: Payer: Self-pay | Admitting: "Endocrinology

## 2017-08-21 VITALS — BP 114/76 | HR 90 | Ht 60.0 in | Wt 200.0 lb

## 2017-08-21 DIAGNOSIS — E039 Hypothyroidism, unspecified: Secondary | ICD-10-CM

## 2017-08-21 MED ORDER — LEVOTHYROXINE SODIUM 125 MCG PO TABS: ORAL_TABLET | ORAL | 1 refills | Status: DC

## 2017-08-21 NOTE — Progress Notes (Signed)
Subjective:    Patient ID: Meagan Woods, female    DOB: [birth year], PCP Hungarland, Jenetta Downer, MD   Past Medical History:  Diagnosis Date  . Depression   . Headache   . Hypothyroidism   . Thyroiditis, autoimmune    Past Surgical History:  Procedure Laterality Date  . NO PAST SURGERIES     Social History   Socioeconomic History  . Marital status: Married    Spouse name: Not on file  . Number of children: Not on file  . Years of education: Not on file  . Highest education level: Not on file  Occupational History  . Not on file  Social Needs  . Financial resource strain: Not on file  . Food insecurity:    Worry: Not on file    Inability: Not on file  . Transportation needs:    Medical: Not on file    Non-medical: Not on file  Tobacco Use  . Smoking status: Never Smoker  . Smokeless tobacco: Never Used  Substance and Sexual Activity  . Alcohol use: No  . Drug use: No  . Sexual activity: Yes  Lifestyle  . Physical activity:    Days per week: Not on file    Minutes per session: Not on file  . Stress: Not on file  Relationships  . Social connections:    Talks on phone: Not on file    Gets together: Not on file    Attends religious service: Not on file    Active member of club or organization: Not on file    Attends meetings of clubs or organizations: Not on file    Relationship status: Not on file  Other Topics Concern  . Not on file  Social History Narrative  . Not on file   Outpatient Encounter Medications as of 08/21/2017  Medication Sig  . butalbital-acetaminophen-caffeine (FIORICET, ESGIC) 50-325-40 MG tablet Take 1 tablet by mouth every 4 (four) hours as needed for headache.  . ibuprofen (ADVIL,MOTRIN) 600 MG tablet Take 1 tablet (600 mg total) by mouth every 6 (six) hours.  Marland Kitchen levothyroxine (SYNTHROID, LEVOTHROID) 125 MCG tablet TAKE 1 TABLET BY MOUTH ONCE DAILY BEFORE BREAKFAST.  Marland Kitchen venlafaxine XR (EFFEXOR-XR) 150 MG 24 hr capsule Take 150 mg by  mouth daily with breakfast.  . [DISCONTINUED] levothyroxine (SYNTHROID, LEVOTHROID) 112 MCG tablet TAKE 1 TABLET BY MOUTH ONCE DAILY BEFORE BREAKFAST.   No facility-administered encounter medications on file as of 08/21/2017.    ALLERGIES: Allergies  Allergen Reactions  . Sulfa Antibiotics Rash   VACCINATION STATUS: Immunization History  Administered Date(s) Administered  . Influenza-Unspecified 12/18/2013  . Tdap 01/14/2014    HPI Meagan Woods is a 37 yr old female with  medical history as above.  She is here to follow-up for primary hypothyroidism.  She is currently on levothyroxine 112 mcg p.o. nightly.  She is compliant.   She has no new complaints except unable to lose weight.  She denies palpitations, sweating, tremors.   she denies hx of goiter, no neck surgery. She did have postpartum thyroiditis   Review of Systems  Constitutional:   + Progressive Weight gain of 20 pounds in 2 years , no fatigue, -subjective sweating. Eyes: no blurry vision, no xerophthalmia ENT: no sore throat, no nodules palpated in throat, no dysphagia/odynophagia, no hoarseness Cardiovascular: No chest pain, no shortness , no palpitations.   Skin: no rashes Neurological: No tremors, no dizziness, no thinking.    Psychiatric: no depression/anxiety  Objective:    BP 114/76   Pulse 90   Ht 5' (1.524 m)   Wt 200 lb (90.7 kg)   BMI 39.06 kg/m   Wt Readings from Last 3 Encounters:  08/21/17 200 lb (90.7 kg)  04/13/17 197 lb (89.4 kg)  09/26/16 188 lb (85.3 kg)    Physical Exam Constitutional:  obese, not in acute distress.   Eyes: PERRLA, EOMI, no exophthalmos ENT:   moist mucous membranes, no thyromegaly, no cervical lymphadenopathy.   Musculoskeletal: no deformities, strength intact in all 4 Skin: moist, warm, no rashes Neurological:   no tremors of outstretched hands .  Labs from Aug 16, 2017 showed free T3 2.3, free T4 104, TSH 3.52 (normal 0.35- 3.74)  Assessment & Plan:   1.  Primary hypothyroidism -She will benefit from slight increase in her levothyroxine dose. -I discussed and increase her levothyroxine to 125 mcg p.o. Nightly.   - We discussed about correct intake of levothyroxine, at fasting, with water, separated by at least 30 minutes from breakfast, and separated by more than 4 hours from calcium, iron, multivitamins, acid reflux medications (PPIs). -Patient is made aware of the fact that thyroid hormone replacement is needed for life, dose to be adjusted by periodic monitoring of thyroid function tests.  - I counseled her to give me a call if she confirms another pregnancy since she will need higher dose of levothyroxine during early first trimester.  - I advised patient to maintain close follow up with their PCP for primary care needs.  Follow up plan: Return in about 6 months (around 02/20/2018) for follow up with pre-visit labs.  Glade Lloyd, MD Phone: [phone]  Fax: [phone]  -  This note was partially dictated with voice recognition software. Similar sounding words can be transcribed inadequately or may not  be corrected upon review.  08/21/2017, 4:42 PM

## 2017-09-08 ENCOUNTER — Other Ambulatory Visit: Payer: Self-pay | Admitting: "Endocrinology

## 2018-01-23 ENCOUNTER — Telehealth: Payer: Self-pay

## 2018-01-23 DIAGNOSIS — E063 Autoimmune thyroiditis: Secondary | ICD-10-CM

## 2018-01-23 NOTE — Telephone Encounter (Signed)
Yes we can offer ultrasound. Put hypothyroidism/Hashimotos as a  dx

## 2018-01-23 NOTE — Telephone Encounter (Signed)
Pt has follow up appt on 02-20-2018. She has labs she will do soon. She was asking if we could order a ultrasound as well because she states she has had  swelling in her neck area and difficulty swallowing. She believes this is from her Thyroid.

## 2018-01-23 NOTE — Telephone Encounter (Signed)
[place] order sent to [facility] for scheduling

## 2018-02-20 ENCOUNTER — Ambulatory Visit: Payer: Managed Care, Other (non HMO) | Admitting: "Endocrinology

## 2018-03-11 ENCOUNTER — Telehealth: Payer: Self-pay

## 2018-03-11 NOTE — Telephone Encounter (Signed)
No significant findings , will discuss next visit.

## 2018-03-11 NOTE — Telephone Encounter (Signed)
Meagan Woods is calling asking for Thyroid Scan Results, please advise?

## 2018-03-11 NOTE — Telephone Encounter (Signed)
Patient is aware 

## 2018-03-20 HISTORY — PX: TUBAL LIGATION: SHX77

## 2018-04-15 ENCOUNTER — Encounter: Payer: Self-pay | Admitting: "Endocrinology

## 2018-04-15 ENCOUNTER — Ambulatory Visit (INDEPENDENT_AMBULATORY_CARE_PROVIDER_SITE_OTHER): Payer: Managed Care, Other (non HMO) | Admitting: "Endocrinology

## 2018-04-15 VITALS — BP 118/76 | HR 87 | Ht 60.0 in | Wt 203.0 lb

## 2018-04-15 DIAGNOSIS — E039 Hypothyroidism, unspecified: Secondary | ICD-10-CM | POA: Diagnosis not present

## 2018-04-15 DIAGNOSIS — E063 Autoimmune thyroiditis: Secondary | ICD-10-CM | POA: Diagnosis not present

## 2018-04-15 MED ORDER — LEVOTHYROXINE SODIUM 137 MCG PO TABS: ORAL_TABLET | ORAL | 1 refills | Status: DC

## 2018-04-15 NOTE — Progress Notes (Signed)
Meagan Woods, CMA  

## 2018-04-15 NOTE — Progress Notes (Signed)
Endocrinology follow-up note   Subjective:    Patient ID: Meagan Woods, female    DOB: [birth year], PCP Hungarland, Jenetta Downer, MD   Past Medical History:  Diagnosis Date  . Depression   . Headache   . Hypothyroidism   . Thyroiditis, autoimmune    Past Surgical History:  Procedure Laterality Date  . NO PAST SURGERIES     Social History   Socioeconomic History  . Marital status: Married    Spouse name: Not on file  . Number of children: Not on file  . Years of education: Not on file  . Highest education level: Not on file  Occupational History  . Not on file  Social Needs  . Financial resource strain: Not on file  . Food insecurity:    Worry: Not on file    Inability: Not on file  . Transportation needs:    Medical: Not on file    Non-medical: Not on file  Tobacco Use  . Smoking status: Never Smoker  . Smokeless tobacco: Never Used  Substance and Sexual Activity  . Alcohol use: No  . Drug use: No  . Sexual activity: Yes  Lifestyle  . Physical activity:    Days per week: Not on file    Minutes per session: Not on file  . Stress: Not on file  Relationships  . Social connections:    Talks on phone: Not on file    Gets together: Not on file    Attends religious service: Not on file    Active member of club or organization: Not on file    Attends meetings of clubs or organizations: Not on file    Relationship status: Not on file  Other Topics Concern  . Not on file  Social History Narrative  . Not on file   Outpatient Encounter Medications as of 04/15/2018  Medication Sig  . butalbital-acetaminophen-caffeine (FIORICET, ESGIC) 50-325-40 MG tablet Take 1 tablet by mouth every 4 (four) hours as needed for headache.  . levothyroxine (SYNTHROID, LEVOTHROID) 137 MCG tablet TAKE 1 TABLET BY MOUTH ONCE DAILY BEFORE BREAKFAST.  Marland Kitchen venlafaxine XR (EFFEXOR-XR) 150 MG 24 hr capsule Take 150 mg by mouth daily with breakfast.  . [DISCONTINUED] ibuprofen (ADVIL,MOTRIN)  600 MG tablet Take 1 tablet (600 mg total) by mouth every 6 (six) hours.  . [DISCONTINUED] levothyroxine (SYNTHROID, LEVOTHROID) 125 MCG tablet TAKE 1 TABLET BY MOUTH ONCE DAILY BEFORE BREAKFAST.  . [DISCONTINUED] Vitamin D, Ergocalciferol, (DRISDOL) 50000 units CAPS capsule TAKE 1 CAPSULE BY MOUTH EVERY 7 DAYS.   No facility-administered encounter medications on file as of 04/15/2018.    ALLERGIES: Allergies  Allergen Reactions  . Sulfa Antibiotics Rash   VACCINATION STATUS: Immunization History  Administered Date(s) Administered  . Influenza-Unspecified 12/18/2013  . Tdap 01/14/2014    HPI [name] is a 38 yr old female with  medical history as above.  She is here to follow-up for primary hypothyroidism.  She is currently on levothyroxine 125 mcg p.o. daily before breakfast.  She is compliant to this medication.   She has no new complaints except unable to lose weight.  She denies palpitations, sweating, tremors.   she denies hx of goiter, no neck surgery. She did have postpartum thyroiditis   Review of Systems  Constitutional:   + Progressive Weight gain of 20 pounds in 2 years , no fatigue, -subjective sweating. Eyes: no blurry vision, no xerophthalmia ENT: no sore throat, no nodules palpated in throat, no dysphagia/odynophagia,  no hoarseness Cardiovascular: No chest pain, no shortness , no palpitations.   Skin: no rashes Neurological: No tremors, no dizziness, no thinking.    Psychiatric: no depression/anxiety  Objective:    BP 118/76   Pulse 87   Ht 5' (1.524 m)   Wt 203 lb (92.1 kg)   BMI 39.65 kg/m   Wt Readings from Last 3 Encounters:  04/15/18 203 lb (92.1 kg)  08/21/17 200 lb (90.7 kg)  04/13/17 197 lb (89.4 kg)    Physical Exam Constitutional:  obese, not in acute distress.   Eyes: PERRLA, EOMI, no exophthalmos ENT:   moist mucous membranes, no thyromegaly, no cervical lymphadenopathy.   Musculoskeletal: no deformities, strength intact in all  4 Skin: moist, warm, no rashes Neurological:   no tremors of outstretched hands .  Labs from Aug 16, 2017 showed free T3 2.3, free T4 104, TSH 3.52 (normal 0.35- 3.74)  April 12, 2018 labs free T4 0.86, TSH 1.9 2  Assessment & Plan:   1. Primary hypothyroidism -She will benefit from slight increase in her levothyroxine dose. -I discussed and increased her levothyroxine to 137 mcg p.o. every morning.     - We discussed about correct intake of levothyroxine, at fasting, with water, separated by at least 30 minutes from breakfast, and separated by more than 4 hours from calcium, iron, multivitamins, acid reflux medications (PPIs). -Patient is made aware of the fact that thyroid hormone replacement is needed for life, dose to be adjusted by periodic monitoring of thyroid function tests.   Her previsit thyroid ultrasound is negative for any discrete nodules.   - I counseled her to give me a call if she confirms another pregnancy since she will need higher dose of levothyroxine during early first trimester.  - I advised patient to maintain close follow up with their PCP for primary care needs.  Follow up plan: Return in about 6 months (around 10/14/2018) for Follow up with Pre-visit Labs.  Glade Lloyd, MD Phone: [phone]  Fax: [phone]  -  This note was partially dictated with voice recognition software. Similar sounding words can be transcribed inadequately or may not  be corrected upon review.  04/15/2018, 3:56 PM

## 2018-04-15 NOTE — Patient Instructions (Signed)
                                       Advice for Weight Management  -For most of [place] the best way to lose weight is by diet management. Generally speaking, diet management means consuming less calories intentionally which over time brings about progressive weight loss.  This can be achieved more effectively by restricting carbohydrate consumption to the minimum possible.  More importantly, our carbohydrates sources should be unprocessed or minimally processed complex starch food items.   Sometimes, it is important to balance nutrition by increasing protein intake (animal or plant source), fruits, and vegetables.  -Sticking to a routine mealtime to eat 3 meals a day and avoiding unnecessary snacks is shown to have a big role in weight control.  -It is better to avoid simple carbohydrates including: Cakes, Sweet Desserts, Ice Cream, Soda (diet and regular), Sweet Tea, Candies, Chips, Cookies, Store Bought Juices, Alcohol in Excess of  1-2 drinks a day, Artificial Sweeteners, Doughnuts, Coffee Creamers, "Sugar-free" Products, etc, etc.  This is not a complete list...Marland Kitchen.    -Consulting with certified diabetes educators is proven to provide you with the most accurate and current information on diet.  Also, you may be  interested in discussing diet options/exchanges , we can schedule a visit with Jearld Fenton, RDN, CDE for individualized nutrition education.  -Exercise: If you are able: 30 -60 minutes a day ,4 days a week, or 150 minutes a week.  The longer the better.  Combine stretch, strength, and aerobic activities.  If you were told in the past that you have high risk for cardiovascular diseases, you may seek evaluation by your heart doctor prior to initiating moderate to intense exercise programs.

## 2018-10-07 ENCOUNTER — Other Ambulatory Visit: Payer: Self-pay | Admitting: "Endocrinology

## 2018-10-14 ENCOUNTER — Ambulatory Visit: Payer: Managed Care, Other (non HMO) | Admitting: "Endocrinology

## 2018-10-29 ENCOUNTER — Telehealth: Payer: Self-pay

## 2018-10-29 NOTE — Telephone Encounter (Signed)
Left 2 messages that we are now seeing pts in the office and her appt on 8-18 will be in person unless there is some reason she feels warrants a virtual visit. And in that case to please call us to discuss. Otherwise we will see her in person on 8-18

## 2018-11-05 ENCOUNTER — Encounter: Payer: Self-pay | Admitting: Gastroenterology

## 2018-11-05 ENCOUNTER — Other Ambulatory Visit: Payer: Self-pay

## 2018-11-05 ENCOUNTER — Ambulatory Visit (INDEPENDENT_AMBULATORY_CARE_PROVIDER_SITE_OTHER): Payer: Managed Care, Other (non HMO) | Admitting: Gastroenterology

## 2018-11-05 VITALS — BP 146/84 | HR 93 | Temp 98.0°F | Ht 61.0 in | Wt 201.0 lb

## 2018-11-05 DIAGNOSIS — R0989 Other specified symptoms and signs involving the circulatory and respiratory systems: Secondary | ICD-10-CM

## 2018-11-05 DIAGNOSIS — R198 Other specified symptoms and signs involving the digestive system and abdomen: Secondary | ICD-10-CM | POA: Diagnosis not present

## 2018-11-05 DIAGNOSIS — R1310 Dysphagia, unspecified: Secondary | ICD-10-CM

## 2018-11-05 MED ORDER — OMEPRAZOLE 40 MG PO CPDR: 40.0000 mg | DELAYED_RELEASE_CAPSULE | Freq: Every day | ORAL | 3 refills | Status: DC

## 2018-11-05 NOTE — Patient Instructions (Addendum)
If you are age 38 or older, your body mass index should be between 23-30. Your Body mass index is 37.98 kg/m. If this is out of the aforementioned range listed, please consider follow up with your Primary Care Provider.  If you are age 36 or younger, your body mass index should be between 19-25. Your Body mass index is 37.98 kg/m. If this is out of the aformentioned range listed, please consider follow up with your Primary Care Provider.   To help prevent the possible spread of infection to our patients, communities, and staff; we will be implementing the following measures:  As of now we are not allowing any visitors/family members to accompany you to any upcoming appointments with [facility]. If you have any concerns about this please contact our office to discuss prior to the appointment.   You have been scheduled for an endoscopy. Please follow written instructions given to you at your visit today. If you use inhalers (even only as needed), please bring them with you on the day of your procedure. Your physician has requested that you go to www.startemmi.com and enter the access code given to you at your visit today. This web site gives a general overview about your procedure. However, you should still follow specific instructions given to you by our office regarding your preparation for the procedure.  We have sent the following medications to your pharmacy for you to pick up at your convenience: Omeprazole 40mg : take daily  Take a over the counter fiber supplement daily.   Thank you for entrusting me with your care and for choosing [facility], Dr. [place] Cellar

## 2018-11-05 NOTE — Progress Notes (Signed)
HPI :  38 y/o female with a history of ADD , migraine headaches, hypothyroidism, referred here by Urban Gibson MD for a new patient visit for dysphagia and globus.  The patient states she reported dysphagia and a globus sensation in her throat starting about 6 months ago.  Initially it was mild and intermittent.  In recent months this has progressed and become much more frequently.  She feels a sensation of something sticking in her throat mostly all the time.  She has no significant relief with clearing her throat or swallowing.  She denies any pain in her mouth or throat.  She does endorse dysphagia when eating solids, tends to feel it most in her upper throat.  She also has the sensation when swallowing pills.  She does not have any swallowing difficulty with drinking liquids.  She often will drink liquids did not attempt to push the food down.  She denies any vomiting or history of impaction.  She has occasional nausea.  No odynophagia.  She does have occasional heartburn but is not too frequent.  She has been using Tums as needed for this.  She denies any persistent abdominal pain but does endorse a sensation of feeling a "hardness" in her mid to upper abdomen at times which comes and goes.  She does have occasional constipation followed by occasional loose stools, and cycles back and forth between the 2.  She has occasional bloating and gas which bothers her.  She denies any blood in the stool at present.  She denies any family history of colon cancer, esophageal cancer, or stomach cancer.  Her grandmother had pancreatic cancer at age 74.  She denies any tobacco use.  She denies any history of allergic disorders such as asthma, eczema.  She denies any tobacco use historically or currently.  She has never had a prior EGD.  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 ADD (attention deficit disorder) without hyperactivity   . Depression   . Dysphagia   . Headache    migraines  . Hypothyroidism   . Thyroiditis, autoimmune      Past Surgical History:  Procedure Laterality Date  . NO PAST SURGERIES     Family History  Problem Relation Age of Onset  . Depression Mother   . Cancer Mother   . Hyperlipidemia Mother   . Hypertension Father   . High Cholesterol Father   . Diabetes Father   . Hypertension Brother   . Ovarian cancer Maternal Grandmother   . Uterine cancer Maternal Grandmother   . Pancreatic cancer Paternal Grandmother   . Diabetes Paternal Grandfather    Social History   Tobacco Use  . Smoking status: Never Smoker  . Smokeless tobacco: Never Used  Substance Use Topics  . Alcohol use: No  . Drug use: No   Current Outpatient Medications  Medication Sig Dispense Refill  . ALPRAZolam (XANAX) 0.5 MG tablet Take 0.5 mg by mouth at bedtime as needed for anxiety.    . butalbital-acetaminophen-caffeine (FIORICET, ESGIC) 50-325-40 MG tablet Take 1 tablet by mouth every 4 (four) hours as needed for headache.    . dextroamphetamine (DEXEDRINE SPANSULE) 15 MG 24 hr capsule Take 15 mg by mouth daily.    Marland Kitchen levothyroxine (SYNTHROID) 137 MCG tablet TAKE 1 TABLET BY  MOUTH ONCE DAILY BEFORE BREAKFAST. 90 tablet 1  . venlafaxine XR (EFFEXOR-XR) 150 MG 24 hr capsule Take 150 mg by mouth daily with breakfast.     No current facility-administered medications for this visit.    Allergies  Allergen Reactions  . Sulfa Antibiotics Rash     Review of Systems: All systems reviewed and negative except where noted in HPI.   Labs per HPI  Physical Exam: BP (!) 146/84   Pulse 93   Temp 98 F (36.7 C) (Oral)   Ht 5\' 1"  (1.549 m)   Wt 201 lb (91.2 kg)   BMI 37.98 kg/m  Constitutional: Pleasant,well-developed, female in no acute distress. HEENT: Normocephalic and atraumatic. Conjunctivae are normal. No scleral icterus.  Neck supple.  Cardiovascular: Normal rate, regular rhythm.  Pulmonary/chest: Effort normal and breath sounds normal. No wheezing, rales or rhonchi. Abdominal: Soft, nondistended, nontender.  There are no masses palpable. No hepatomegaly. Extremities: no edema Lymphadenopathy: No cervical adenopathy noted. Neurological: Alert and oriented to person place and time. Skin: Skin is warm and dry. No rashes noted. Psychiatric: Normal mood and affect. Behavior is normal.   ASSESSMENT AND PLAN: 38 year old female here for new patient consultation for the following  Dysphagia / Globus - intermittent dysphasia and globus sensation which has since progressed over the last few months and now bother her frequently.  I discussed differential diagnosis with her for both of these.  She does have some periodic heartburn and reflux could be related to some of these symptoms.  I recommend an upper endoscopy to further evaluate and potentially treat with dilation pending findings - will assess for reflux esophagitis, EoE, stricture, etc.  I discussed the risks and benefits of endoscopy and anesthesia and she wanted to proceed.  In the interim I recommend a trial of omeprazole 40 mg once a day for 4 weeks to see if this improves her symptoms at all.  She agreed with the plan, further recommendations pending results of EGD and her course on omeprazole.  Irregular bowel habits -she alternates intermittent loose stools and constipated stools at times, with periods of normal bowel habits.  Discussed options with her, recommend a daily fiber supplement such as Citrucel once daily to see if this can help normalize her bowel habits.  She can contact me as needed for reassessment if symptoms persist.  Pupukea Cellar, MD [facility]  CC: Hungarland, Jenetta Downer

## 2018-11-07 ENCOUNTER — Telehealth: Payer: Self-pay

## 2018-11-07 NOTE — Telephone Encounter (Signed)
Patient answered "NO" to all covid-19 questions °

## 2018-11-07 NOTE — Telephone Encounter (Signed)
Covid-19 screening questions   Do you now or have you had a fever in the last 14 days?  Do you have any respiratory symptoms of shortness of breath or cough now or in the last 14 days?  Do you have any family members or close contacts with diagnosed or suspected Covid-19 in the past 14 days?  Have you been tested for Covid-19 and found to be positive?      Left message to c/b.

## 2018-11-08 ENCOUNTER — Encounter: Payer: Self-pay | Admitting: Gastroenterology

## 2018-11-08 ENCOUNTER — Ambulatory Visit (AMBULATORY_SURGERY_CENTER): Payer: Managed Care, Other (non HMO) | Admitting: Gastroenterology

## 2018-11-08 ENCOUNTER — Other Ambulatory Visit: Payer: Self-pay

## 2018-11-08 VITALS — BP 128/72 | HR 80 | Temp 97.7°F | Resp 19 | Ht 61.0 in | Wt 201.0 lb

## 2018-11-08 DIAGNOSIS — K449 Diaphragmatic hernia without obstruction or gangrene: Secondary | ICD-10-CM

## 2018-11-08 DIAGNOSIS — R1310 Dysphagia, unspecified: Secondary | ICD-10-CM

## 2018-11-08 MED ORDER — SODIUM CHLORIDE 0.9 % IV SOLN: 500.0000 mL | Freq: Once | INTRAVENOUS | Status: AC

## 2018-11-08 NOTE — Progress Notes (Signed)
A and O x3. Report to RN. Tolerated MAC anesthesia well.Teeth unchanged after procedure.

## 2018-11-08 NOTE — Op Note (Signed)
[facility] Patient Name: Meagan Woods Procedure Date: 11/08/2018 1:19 PM MRN: [id number] Endoscopist: Remo Lipps P. Havery Moros , MD Age: 38 Referring MD:  Date of Birth: 1980-07-24 Gender: Female Account #: [id number] Procedure:                Upper GI endoscopy Indications:              Dysphagia, Globus sensation Medicines:                Monitored Anesthesia Care Procedure:                Pre-Anesthesia Assessment:                           - Prior to the procedure, a History and Physical                            was performed, and patient medications and                            allergies were reviewed. The patient's tolerance of                            previous anesthesia was also reviewed. The risks                            and benefits of the procedure and the sedation                            options and risks were discussed with the patient.                            All questions were answered, and informed consent                            was obtained. Prior Anticoagulants: The patient has                            taken no previous anticoagulant or antiplatelet                            agents. ASA Grade Assessment: II - A patient with                            mild systemic disease. After reviewing the risks                            and benefits, the patient was deemed in                            satisfactory condition to undergo the procedure.                           After obtaining informed consent, the endoscope was  passed under direct vision. Throughout the                            procedure, the patient's blood pressure, pulse, and                            oxygen saturations were monitored continuously. The                            Endoscope was introduced through the mouth, and                            advanced to the second part of duodenum. The upper                            GI endoscopy was  accomplished without difficulty.                            The patient tolerated the procedure well. Scope In: Scope Out: Findings:                 Esophagogastric landmarks were identified: the                            Z-line was found at 36 cm, the gastroesophageal                            junction was found at 36 cm and the upper extent of                            the gastric folds was found at 37 cm from the                            incisors.                           A 1 cm hiatal hernia was present.                           The exam of the esophagus was otherwise normal. No                            obvious stenosis / stricture. No obvious                            inflammatory changes.                           A guidewire was placed and the scope was withdrawn.                            Empiric dilation was performed in the entire  esophagus with a Savary dilator with mild                            resistance at 17 mm and 18 mm. Relook endoscopy                            showed no mucosal wrents. Biopsies were taken with                            a cold forceps in the upper third of the esophagus,                            in the middle third of the esophagus and in the                            lower third of the esophagus for histology.                           The entire examined stomach was normal.                           The duodenal bulb and second portion of the                            duodenum were normal. Complications:            No immediate complications. Estimated blood loss:                            Minimal. Estimated Blood Loss:     Estimated blood loss was minimal. Impression:               - Esophagogastric landmarks identified.                           - 1 cm hiatal hernia.                           - Normal esophagus otherwise - empiric dilation                            performed to 82mm, biopsies taken to  rule out                            eosinophilic esophagitis                           - Normal stomach.                           - Normal duodenal bulb and second portion of the                            duodenum. Recommendation:           - Patient has a contact number available for  emergencies. The signs and symptoms of potential                            delayed complications were discussed with the                            patient. Return to normal activities tomorrow.                            Written discharge instructions were provided to the                            patient.                           - Resume previous diet.                           - Continue present medications.                           - Await pathology results and course post-dilation. Remo Lipps P. Havery Moros, MD 11/08/2018 1:48:18 PM This report has been signed electronically.

## 2018-11-08 NOTE — Progress Notes (Signed)
Temp per Denny Peon VS per Loma Sousa

## 2018-11-08 NOTE — Patient Instructions (Signed)
YOU HAD AN ENDOSCOPIC PROCEDURE TODAY AT THE [facility]:   Refer to the procedure report that was given to you for any specific questions about what was found during the examination.  If the procedure report does not answer your questions, please call your gastroenterologist to clarify.  If you requested that your care partner not be given the details of your procedure findings, then the procedure report has been included in a sealed envelope for you to review at your convenience later.  YOU SHOULD EXPECT: Some feelings of bloating in the abdomen. Passage of more gas than usual.  Walking can help get rid of the air that was put into your GI tract during the procedure and reduce the bloating. If you had a lower endoscopy (such as a colonoscopy or flexible sigmoidoscopy) you may notice spotting of blood in your stool or on the toilet paper. If you underwent a bowel prep for your procedure, you may not have a normal bowel movement for a few days.  Please Note:  You might notice some irritation and congestion in your nose or some drainage.  This is from the oxygen used during your procedure.  There is no need for concern and it should clear up in a day or so.  SYMPTOMS TO REPORT IMMEDIATELY:    Following upper endoscopy (EGD)  Vomiting of blood or coffee ground material  New chest pain or pain under the shoulder blades  Painful or persistently difficult swallowing  New shortness of breath  Fever of 100F or higher  Black, tarry-looking stools  For urgent or emergent issues, a gastroenterologist can be reached at any hour by calling [phone].   DIET:  We do recommend a small meal at first, but then you may proceed to your regular diet.  Drink plenty of fluids but you should avoid alcoholic beverages for 24 hours.  MEDICATIONS: Continue present medications.  Please see handouts given to you by your recovery nurse.  ACTIVITY:  You should plan to take it easy for the rest of  today and you should NOT DRIVE or use heavy machinery until tomorrow (because of the sedation medicines used during the test).    FOLLOW UP: Our staff will call the number listed on your records 48-72 hours following your procedure to check on you and address any questions or concerns that you may have regarding the information given to you following your procedure. If we do not reach you, we will leave a message.  We will attempt to reach you two times.  During this call, we will ask if you have developed any symptoms of COVID 19. If you develop any symptoms (ie: fever, flu-like symptoms, shortness of breath, cough etc.) before then, please call [phone].  If you test positive for Covid 19 in the 2 weeks post procedure, please call and report this information to us.    If any biopsies were taken you will be contacted by phone or by letter within the next 1-3 weeks.  Please call us at [phone] if you have not heard about the biopsies in 3 weeks.   Thank you for allowing us to provide for your healthcare needs today.   SIGNATURES/CONFIDENTIALITY: You and/or your care partner have signed paperwork which will be entered into your electronic medical record.  These signatures attest to the fact that that the information above on your After Visit Summary has been reviewed and is understood.  Full responsibility of the confidentiality of this discharge   information lies with you and/or your care-partner. 

## 2018-11-11 ENCOUNTER — Telehealth: Payer: Self-pay

## 2018-11-11 NOTE — Telephone Encounter (Signed)
Called #[phone] and left a messaged we tried to reach pt for a follow up call. maw

## 2018-11-11 NOTE — Telephone Encounter (Signed)
Left message on follow up call. 

## 2018-12-09 ENCOUNTER — Telehealth: Payer: Self-pay | Admitting: Gastroenterology

## 2018-12-09 NOTE — Telephone Encounter (Signed)
Resent the results that Dr. Havery Moros had sent via My Chart on 11/14/18

## 2018-12-19 ENCOUNTER — Other Ambulatory Visit: Payer: Self-pay

## 2018-12-19 DIAGNOSIS — R1310 Dysphagia, unspecified: Secondary | ICD-10-CM

## 2018-12-19 NOTE — Telephone Encounter (Signed)
Okay. I empirically dilated her but I guess it did not help. Not sure if this is a issue with motility or not driving this process. I'd like to do a barium swallow with tablet to see if that can help localize her dysphagia and get a general sense of motility, if you can order for her. Thanks!

## 2018-12-19 NOTE — Telephone Encounter (Signed)
Patient scheduled for Barium swallow study 12/30/18 @ 8:30am at [facility]. Notified to be NPO 3 hrs. Before and arrive 15 mins before

## 2018-12-19 NOTE — Telephone Encounter (Signed)
Called patient back and she did finally see the result note form her EGD on 11/08/18. Meagan Woods she didn't know she had My Char)t. She also said within a few days of having the EGD, she started having trouble swallowing even soft solids again. Liquids go down ok.Said she feels like something is in her throat.

## 2018-12-19 NOTE — Telephone Encounter (Signed)
Pt stated that she does not use MyChart an inquired about results.

## 2018-12-30 ENCOUNTER — Ambulatory Visit (HOSPITAL_COMMUNITY): Admission: RE | Admit: 2018-12-30 | Payer: Managed Care, Other (non HMO) | Source: Ambulatory Visit

## 2019-01-09 ENCOUNTER — Other Ambulatory Visit: Payer: Self-pay | Admitting: "Endocrinology

## 2019-01-09 DIAGNOSIS — E039 Hypothyroidism, unspecified: Secondary | ICD-10-CM

## 2019-01-09 DIAGNOSIS — E559 Vitamin D deficiency, unspecified: Secondary | ICD-10-CM

## 2019-01-17 LAB — TSH: TSH: 1.23 u[IU]/mL (ref 0.450–4.500)

## 2019-01-17 LAB — VITAMIN D 25 HYDROXY (VIT D DEFICIENCY, FRACTURES): Vit D, 25-Hydroxy: 21.7 ng/mL — ABNORMAL LOW (ref 30.0–100.0)

## 2019-01-17 LAB — T4, FREE: Free T4: 1.47 ng/dL (ref 0.82–1.77)

## 2019-01-20 ENCOUNTER — Encounter: Payer: Self-pay | Admitting: "Endocrinology

## 2019-01-20 ENCOUNTER — Ambulatory Visit (INDEPENDENT_AMBULATORY_CARE_PROVIDER_SITE_OTHER): Payer: Managed Care, Other (non HMO) | Admitting: "Endocrinology

## 2019-01-20 ENCOUNTER — Other Ambulatory Visit: Payer: Self-pay

## 2019-01-20 DIAGNOSIS — E559 Vitamin D deficiency, unspecified: Secondary | ICD-10-CM | POA: Diagnosis not present

## 2019-01-20 DIAGNOSIS — E039 Hypothyroidism, unspecified: Secondary | ICD-10-CM | POA: Diagnosis not present

## 2019-01-20 MED ORDER — VITAMIN D3 125 MCG (5000 UT) PO CAPS: 5000.0000 [IU] | ORAL_CAPSULE | Freq: Every day | ORAL | 0 refills | Status: DC

## 2019-01-20 MED ORDER — LEVOTHYROXINE SODIUM 137 MCG PO TABS: 137.0000 ug | ORAL_TABLET | Freq: Every day | ORAL | 3 refills | Status: DC

## 2019-01-20 NOTE — Progress Notes (Signed)
01/20/2019                                Endocrinology Telehealth Visit Follow up Note -During COVID -19 Pandemic  I connected with Meagan Woods on 01/20/2019   by telephone and verified that I am speaking with the correct person using two identifiers. Meagan Woods, April 26, 1980. she has verbally consented to this visit. All issues noted in this document were discussed and addressed. The format was not optimal for physical exam.   Subjective:    Patient ID: Meagan Woods, female    DOB: 11-18-1980, PCP Hungarland, Jenetta Downer, MD   Past Medical History:  Diagnosis Date  . ADD (attention deficit disorder) without hyperactivity   . Depression   . Dysphagia   . Headache    migraines  . Hypothyroidism   . Thyroiditis, autoimmune    Past Surgical History:  Procedure Laterality Date  . NO PAST SURGERIES     Social History   Socioeconomic History  . Marital status: Married    Spouse name: Not on file  . Number of children: Not on file  . Years of education: Not on file  . Highest education level: Not on file  Occupational History  . Not on file  Social Needs  . Financial resource strain: Not on file  . Food insecurity    Worry: Not on file    Inability: Not on file  . Transportation needs    Medical: Not on file    Non-medical: Not on file  Tobacco Use  . Smoking status: Never Smoker  . Smokeless tobacco: Never Used  Substance and Sexual Activity  . Alcohol use: No  . Drug use: No  . Sexual activity: Yes  Lifestyle  . Physical activity    Days per week: Not on file    Minutes per session: Not on file  . Stress: Not on file  Relationships  . Social Herbalist on phone: Not on file    Gets together: Not on file    Attends religious service: Not on file    Active member of club or organization: Not on file    Attends meetings of clubs or organizations: Not on file    Relationship status: Not on file  Other Topics Concern  . Not on file  Social  History Narrative  . Not on file   Outpatient Encounter Medications as of 01/20/2019  Medication Sig  . ALPRAZolam (XANAX) 0.5 MG tablet Take 0.5 mg by mouth at bedtime as needed for anxiety.  . butalbital-acetaminophen-caffeine (FIORICET, ESGIC) 50-325-40 MG tablet Take 1 tablet by mouth every 4 (four) hours as needed for headache.  . Cholecalciferol (VITAMIN D3) 125 MCG (5000 UT) CAPS Take 1 capsule (5,000 Units total) by mouth daily.  Marland Kitchen dextroamphetamine (DEXEDRINE SPANSULE) 15 MG 24 hr capsule Take 15 mg by mouth daily.  Marland Kitchen levothyroxine (SYNTHROID) 137 MCG tablet Take 1 tablet (137 mcg total) by mouth daily before breakfast.  . omeprazole (PRILOSEC) 40 MG capsule Take 1 capsule (40 mg total) by mouth daily.  Marland Kitchen venlafaxine XR (EFFEXOR-XR) 150 MG 24 hr capsule Take 150 mg by mouth daily with breakfast.  . [DISCONTINUED] levothyroxine (SYNTHROID) 137 MCG tablet TAKE 1 TABLET BY MOUTH ONCE DAILY BEFORE BREAKFAST.   Facility-Administered Encounter Medications as of 01/20/2019  Medication  . 0.9 %  sodium chloride infusion   ALLERGIES: Allergies  Allergen  Reactions  . Sulfa Antibiotics Rash   VACCINATION STATUS: Immunization History  Administered Date(s) Administered  . Influenza-Unspecified 12/18/2013  . Tdap 01/14/2014    HPI [name] is a 38 yr old female with  medical history as above.  She is being engaged in telehealth via telephone in follow-up for  primary hypothyroidism.  She is currently on levothyroxine 137 mcg p.o. daily before breakfast.  She is compliant to this medication.   She has no new complaints except unable to lose weight.  She denies palpitations, sweating, tremors.   she denies hx of goiter, no neck surgery. She did have postpartum thyroiditis   Review of Systems Limited as above.  Objective:    There were no vitals taken for this visit.  Wt Readings from Last 3 Encounters:  11/08/18 201 lb (91.2 kg)  11/05/18 201 lb (91.2 kg)  04/15/18 203 lb  (92.1 kg)      Labs from Aug 16, 2017 showed free T3 2.3, free T4 104, TSH 3.52 (normal 0.35- 3.74)  April 12, 2018 labs free T4 0.86, TSH 1.9 2 Recent Results (from the past 2160 hour(s))  TSH     Status: None   Collection Time: 01/16/19 12:00 AM  Result Value Ref Range   TSH 1.230 0.450 - 4.500 uIU/mL  T4, Free     Status: None   Collection Time: 01/16/19 12:00 AM  Result Value Ref Range   Free T4 1.47 0.82 - 1.77 ng/dL  VITAMIN D 25 Hydroxy (Vit-D Deficiency, Fractures)     Status: Abnormal   Collection Time: 01/16/19 12:00 AM  Result Value Ref Range   Vit D, 25-Hydroxy 21.7 (L) 30.0 - 100.0 ng/mL    Comment: Vitamin D deficiency has been defined by the [place] and an Endocrine Society practice guideline as a level of serum 25-OH vitamin D less than 20 ng/mL (1,2). The Endocrine Society went on to further define vitamin D insufficiency as a level between 21 and 29 ng/mL (2). 1. IOM (Institute of Medicine). 2010. Dietary reference    intakes for calcium and D. [place]: The    [employer]. 2. Holick MF, Binkley Ravena, Bischoff-Ferrari HA, et al.    Evaluation, treatment, and prevention of vitamin D    deficiency: an Endocrine Society clinical practice    guideline. JCEM. 2011 Jul; 96(7):1911-30.      Assessment & Plan:   1. Primary hypothyroidism 2.  Vitamin D deficiency -Her previsit thyroid function tests are consistent with appropriate replacement.  She is advised to continue her levothyroxine  137 mcg p.o. every morning.      - We discussed about the correct intake of her thyroid hormone, on empty stomach at fasting, with water, separated by at least 30 minutes from breakfast and other medications,  and separated by more than 4 hours from calcium, iron, multivitamins, acid reflux medications (PPIs). -Patient is made aware of the fact that thyroid hormone replacement is needed for life, dose to be adjusted by periodic monitoring of thyroid  function tests.  Her previsit thyroid ultrasound is negative for any discrete nodules.    -I discussed initiated vitamin D3 5000 units daily for the next 90 days.  - I advised patient to maintain close follow up with their PCP for primary care needs.  Time for this visit: 15 minutes. Meagan Woods  participated in the discussions, expressed understanding, and voiced agreement with the above plans.  All questions were answered to her satisfaction.  she is encouraged to contact clinic should she have any questions or concerns prior to her return visit.  Follow up plan: Return in about 1 year (around 01/20/2020) for Follow up with Pre-visit Labs.  Glade Lloyd, MD Phone: [phone]  Fax: [phone]  -  This note was partially dictated with voice recognition software. Similar sounding words can be transcribed inadequately or may not  be corrected upon review.  01/20/2019, 3:21 PM

## 2019-03-21 HISTORY — PX: LYMPH NODE DISSECTION: SHX5087

## 2019-03-21 HISTORY — PX: CERVICAL ABLATION: SHX5771

## 2019-04-16 ENCOUNTER — Other Ambulatory Visit: Payer: Self-pay | Admitting: "Endocrinology

## 2020-01-20 ENCOUNTER — Ambulatory Visit: Payer: Managed Care, Other (non HMO) | Admitting: Nurse Practitioner

## 2020-02-05 ENCOUNTER — Ambulatory Visit: Payer: Managed Care, Other (non HMO) | Admitting: Nurse Practitioner

## 2020-02-09 LAB — TSH: TSH: 1.65 (ref 0.41–5.90)

## 2020-02-09 LAB — COMPREHENSIVE METABOLIC PANEL
Calcium: 9.6 (ref 8.7–10.7)
GFR calc Af Amer: 120
GFR calc non Af Amer: 104

## 2020-02-09 LAB — VITAMIN B12: Vitamin B-12: 636

## 2020-02-09 LAB — HEMOGLOBIN A1C: Hemoglobin A1C: 5.4

## 2020-02-09 LAB — BASIC METABOLIC PANEL
BUN: 12 (ref 4–21)
Creatinine: 0.7 (ref 0.5–1.1)

## 2020-02-10 ENCOUNTER — Other Ambulatory Visit: Payer: Self-pay

## 2020-02-10 ENCOUNTER — Encounter: Payer: Self-pay | Admitting: Nurse Practitioner

## 2020-02-10 ENCOUNTER — Ambulatory Visit (INDEPENDENT_AMBULATORY_CARE_PROVIDER_SITE_OTHER): Payer: 59 | Admitting: Nurse Practitioner

## 2020-02-10 VITALS — BP 112/70 | HR 101 | Ht 61.0 in | Wt 201.6 lb

## 2020-02-10 DIAGNOSIS — E559 Vitamin D deficiency, unspecified: Secondary | ICD-10-CM

## 2020-02-10 DIAGNOSIS — E039 Hypothyroidism, unspecified: Secondary | ICD-10-CM | POA: Diagnosis not present

## 2020-02-10 LAB — VITAMIN D 25 HYDROXY (VIT D DEFICIENCY, FRACTURES): Vit D, 25-Hydroxy: 43.6

## 2020-02-10 LAB — BASIC METABOLIC PANEL: Glucose: 112

## 2020-02-10 LAB — COMPREHENSIVE METABOLIC PANEL: GFR calc non Af Amer: 104

## 2020-02-10 LAB — TSH: TSH: 1.65 (ref 0.41–5.90)

## 2020-02-10 NOTE — Patient Instructions (Signed)

## 2020-02-10 NOTE — Progress Notes (Signed)
02/10/2020                                Endocrinology Telehealth Visit Follow up Note -During COVID -19 Pandemic  I connected with Meagan Woods on 02/10/2020   by telephone and verified that I am speaking with the correct person using two identifiers. Meagan Woods, November 22, 1980. she has verbally consented to this visit. All issues noted in this document were discussed and addressed. The format was not optimal for physical exam.   Subjective:    Patient ID: Meagan Woods, female    DOB: 1980/04/09, PCP Hungarland, Jenetta Downer, MD   Past Medical History:  Diagnosis Date  . ADD (attention deficit disorder) without hyperactivity   . Depression   . Dysphagia   . Headache    migraines  . Hypothyroidism   . Thyroiditis, autoimmune    Past Surgical History:  Procedure Laterality Date  . NO PAST SURGERIES     Social History   Socioeconomic History  . Marital status: Married    Spouse name: Not on file  . Number of children: Not on file  . Years of education: Not on file  . Highest education level: Not on file  Occupational History  . Not on file  Tobacco Use  . Smoking status: Never Smoker  . Smokeless tobacco: Never Used  Vaping Use  . Vaping Use: Never used  Substance and Sexual Activity  . Alcohol use: No  . Drug use: No  . Sexual activity: Yes  Other Topics Concern  . Not on file  Social History Narrative  . Not on file   Social Determinants of Health   Financial Resource Strain:   . Difficulty of Paying Living Expenses: Not on file  Food Insecurity:   . Worried About Charity fundraiser in the Last Year: Not on file  . Ran Out of Food in the Last Year: Not on file  Transportation Needs:   . Lack of Transportation (Medical): Not on file  . Lack of Transportation (Non-Medical): Not on file  Physical Activity:   . Days of Exercise per Week: Not on file  . Minutes of Exercise per Session: Not on file  Stress:   . Feeling of Stress : Not on file  Social  Connections:   . Frequency of Communication with Friends and Family: Not on file  . Frequency of Social Gatherings with Friends and Family: Not on file  . Attends Religious Services: Not on file  . Active Member of Clubs or Organizations: Not on file  . Attends Archivist Meetings: Not on file  . Marital Status: Not on file   Outpatient Encounter Medications as of 02/10/2020  Medication Sig  . ALPRAZolam (XANAX) 0.5 MG tablet Take 0.5 mg by mouth at bedtime as needed for anxiety.  . Cholecalciferol (VITAMIN D3) 125 MCG (5000 UT) CAPS TAKE 1 CAPSULE BY MOUTH ONCE DAILY  . dextroamphetamine (DEXEDRINE SPANSULE) 15 MG 24 hr capsule Take 15 mg by mouth daily.  Marland Kitchen levothyroxine (SYNTHROID) 137 MCG tablet Take 1 tablet (137 mcg total) by mouth daily before breakfast.  . venlafaxine XR (EFFEXOR-XR) 150 MG 24 hr capsule Take 150 mg by mouth daily with breakfast.  . [DISCONTINUED] butalbital-acetaminophen-caffeine (FIORICET, ESGIC) 50-325-40 MG tablet Take 1 tablet by mouth every 4 (four) hours as needed for headache.  . [DISCONTINUED] omeprazole (PRILOSEC) 40 MG capsule Take 1 capsule (40 mg  total) by mouth daily.   Facility-Administered Encounter Medications as of 02/10/2020  Medication  . 0.9 %  sodium chloride infusion   ALLERGIES: Allergies  Allergen Reactions  . Sulfa Antibiotics Rash   VACCINATION STATUS: Immunization History  Administered Date(s) Administered  . Influenza-Unspecified 12/18/2013  . Tdap 01/14/2014    Thyroid Problem Presents for follow-up visit. Symptoms include fatigue. Patient reports no anxiety, cold intolerance, constipation, depressed mood, diarrhea, heat intolerance, palpitations, tremors, weight gain or weight loss. The symptoms have been improving.   She is currently on levothyroxine 137 mcg p.o. daily before breakfast.  She is compliant to this medication.   She has no new complaints except unable to lose weight.  She denies palpitations,  sweating, tremors.   she denies hx of goiter, no neck surgery. She did have postpartum thyroiditis   Review of systems  Constitutional: + Minimally fluctuating body weight,  current Body mass index is 38.09 kg/m. , + fatigue, no subjective hyperthermia, no subjective hypothermia Eyes: no blurry vision, no xerophthalmia ENT: no sore throat, no nodules palpated in throat, no dysphagia/odynophagia, no hoarseness Cardiovascular: no chest pain, no shortness of breath, no palpitations, no leg swelling Respiratory: no cough, no shortness of breath Gastrointestinal: no nausea/vomiting/diarrhea Musculoskeletal: no muscle/joint aches Skin: no rashes, no hyperemia Neurological: no tremors, no numbness, no tingling, no dizziness Psychiatric: no depression, no anxiety  Objective:    BP 112/70   Pulse (!) 101   Ht 5\' 1"  (1.549 m)   Wt 201 lb 9.6 oz (91.4 kg)   BMI 38.09 kg/m   Wt Readings from Last 3 Encounters:  02/10/20 201 lb 9.6 oz (91.4 kg)  11/08/18 201 lb (91.2 kg)  11/05/18 201 lb (91.2 kg)    BP Readings from Last 3 Encounters:  02/10/20 112/70  11/08/18 128/72  11/05/18 (!) 146/84     Physical Exam- Limited  Constitutional:  Body mass index is 38.09 kg/m. , not in acute distress, normal state of mind Eyes:  EOMI, no exophthalmos Neck: Supple Thyroid: No gross goiter Cardiovascular: RRR, no murmers, rubs, or gallops, no edema Respiratory: Adequate breathing efforts, no crackles, rales, rhonchi, or wheezing Musculoskeletal: no gross deformities, strength intact in all four extremities, no gross restriction of joint movements Skin:  no rashes, no hyperemia Neurological: no tremor with outstretched hands   Labs from Aug 16, 2017 showed free T3 2.3, free T4 104, TSH 3.52 (normal 0.35- 3.74)  April 12, 2018 labs free T4 0.86, TSH 1.9 2 Recent Results (from the past 2160 hour(s))  Basic metabolic panel     Status: None   Collection Time: 02/09/20 12:00 AM  Result  Value Ref Range   BUN 12 4 - 21   Creatinine 0.7 0.5 - 1.1  Comprehensive metabolic panel     Status: None   Collection Time: 02/09/20 12:00 AM  Result Value Ref Range   GFR calc Af Amer 120    GFR calc non Af Amer 104    Calcium 9.6 8.7 - 10.7  Vitamin B12     Status: None   Collection Time: 02/09/20 12:00 AM  Result Value Ref Range   Vitamin B-12 636   Hemoglobin A1c     Status: None   Collection Time: 02/09/20 12:00 AM  Result Value Ref Range   Hemoglobin A1C 5.4   TSH     Status: None   Collection Time: 02/09/20 12:00 AM  Result Value Ref Range   TSH 1.65 0.41 - 5.90  Comment: Free T4 1.36     Assessment & Plan:   1. Primary hypothyroidism  -Her previsit TFTs are consistent with appropriate hormone replacement.  She is advised to continue Levothyroxine 137 mcg po daily before breakfast.    - We discussed about the correct intake of her thyroid hormone, on empty stomach at fasting, with water, separated by at least 30 minutes from breakfast and other medications,  and separated by more than 4 hours from calcium, iron, multivitamins, acid reflux medications (PPIs). -Patient is made aware of the fact that thyroid hormone replacement is needed for life, dose to be adjusted by periodic monitoring of thyroid function tests.  2. Vitamin D deficiency -Her most recent vitamin D level  From 11/23 was 43.6.  She is advised to continue Vitamin D 3 5000 units daily as maintenance dose during the winter months.  - I advised patient to maintain close follow up with their PCP for primary care needs.     - Time spent on this patient care encounter:  20 minutes of which 50% was spent in  counseling and the rest reviewing  her current and  previous labs / studies and medications  doses and developing a plan for long term care. Meagan Woods  participated in the discussions, expressed understanding, and voiced agreement with the above plans.  All questions were answered to her  satisfaction. she is encouraged to contact clinic should she have any questions or concerns prior to her return visit.    Follow up plan: Return in about 4 months (around 06/09/2020) for Thyroid follow up, Previsit labs, Virtual visit ok.  Rayetta Pigg, [facility] [address] [place], [place] [address] Phone: [phone] Fax: [phone]  02/10/2020, 1:51 PM

## 2020-04-15 ENCOUNTER — Other Ambulatory Visit: Payer: Self-pay | Admitting: "Endocrinology

## 2020-06-09 ENCOUNTER — Telehealth: Payer: 59 | Admitting: Nurse Practitioner

## 2020-06-18 ENCOUNTER — Telehealth: Payer: 59 | Admitting: Nurse Practitioner

## 2020-06-30 LAB — T4, FREE: Free T4: 1.4 ng/dL (ref 0.82–1.77)

## 2020-06-30 LAB — TSH: TSH: 1.5 u[IU]/mL (ref 0.450–4.500)

## 2020-07-05 ENCOUNTER — Encounter: Payer: Self-pay | Admitting: Nurse Practitioner

## 2020-07-05 ENCOUNTER — Telehealth (INDEPENDENT_AMBULATORY_CARE_PROVIDER_SITE_OTHER): Payer: 59 | Admitting: Nurse Practitioner

## 2020-07-05 DIAGNOSIS — E559 Vitamin D deficiency, unspecified: Secondary | ICD-10-CM

## 2020-07-05 DIAGNOSIS — E039 Hypothyroidism, unspecified: Secondary | ICD-10-CM | POA: Diagnosis not present

## 2020-07-05 NOTE — Progress Notes (Signed)
07/05/2020                Meagan Follow Up Note   TELEHEALTH VISIT: Meagan patient is being engaged in telehealth visit due to COVID-19.  This type of visit limits physical examination significantly, and thus is not preferable over face-to-face encounters.  I connected with  Meagan Woods on 07/05/20 by a video enabled telemedicine application and verified that I am speaking with Meagan correct person using two identifiers.   I discussed Meagan limitations of evaluation and management by telemedicine. Meagan patient expressed understanding and agreed to proceed.    Meagan participants involved in this visit include: Meagan Romp, NP located at [facility] and Meagan Woods  located at their personal residence listed.     Subjective:    Patient ID: Meagan Woods, female    DOB: 01-25-1981, PCP Hungarland, Jenetta Downer, MD   Past Medical History:  Diagnosis Date  . ADD (attention deficit disorder) without hyperactivity   . Depression   . Dysphagia   . Headache    migraines  . Hypothyroidism   . Thyroiditis, autoimmune    Past Surgical History:  Procedure Laterality Date  . NO PAST SURGERIES     Social History   Socioeconomic History  . Marital status: Married    Spouse name: Not on file  . Number of children: Not on file  . Years of education: Not on file  . Highest education level: Not on file  Occupational History  . Not on file  Tobacco Use  . Smoking status: Never Smoker  . Smokeless tobacco: Never Used  Vaping Use  . Vaping Use: Never used  Substance and Sexual Activity  . Alcohol use: No  . Drug use: No  . Sexual activity: Yes  Other Topics Concern  . Not on file  Social History Narrative  . Not on file   Social Determinants of Health   Financial Resource Strain: Not on file  Food Insecurity: Not on file  Transportation Needs: Not on file  Physical Activity: Not on file  Stress: Not on file  Social Connections: Not on file    Outpatient Encounter Medications as of 07/05/2020  Medication Sig  . ALPRAZolam (XANAX) 0.5 MG tablet Take 0.5 mg by mouth at bedtime as needed for anxiety.  Marland Kitchen amphetamine-dextroamphetamine (ADDERALL XR) 15 MG 24 hr capsule Adderall XR 15 mg capsule,extended release  . Cholecalciferol (VITAMIN D3) 125 MCG (5000 UT) CAPS TAKE 1 CAPSULE BY MOUTH ONCE DAILY  . levothyroxine (SYNTHROID) 137 MCG tablet TAKE 1 TABLET (137 MCG TOTAL) BY MOUTH ONCE DAILY BEFORE BREAKFAST  . venlafaxine XR (EFFEXOR-XR) 150 MG 24 hr capsule Take 150 mg by mouth daily with breakfast.  . [DISCONTINUED] dextroamphetamine (DEXEDRINE SPANSULE) 15 MG 24 hr capsule Take 15 mg by mouth daily.   Facility-Administered Encounter Medications as of 07/05/2020  Medication  . 0.9 %  sodium chloride infusion   ALLERGIES: Allergies  Allergen Reactions  . Sulfa Antibiotics Rash   VACCINATION STATUS: Immunization History  Administered Date(s) Administered  . Influenza-Unspecified 12/18/2013  . Tdap 01/14/2014    Thyroid Problem Presents for follow-up visit. Symptoms include fatigue. Patient reports no anxiety, cold intolerance, constipation, depressed mood, diarrhea, heat intolerance, palpitations, tremors, weight gain or weight loss. Meagan symptoms have been stable.  Hypoglycemia This is a new problem. Meagan current episode started more than 1 month ago. Meagan problem occurs intermittently. Meagan problem has been waxing and waning. Associated symptoms include fatigue.  Associated symptoms comments: Dizziness, sweaty. Exacerbated by: consuming more sugar than usual, meal timing. She has tried eating for Meagan symptoms. Meagan treatment provided moderate relief.   She is currently on Levothyroxine 137 mcg p.o. daily before breakfast.  She is compliant to this medication.   She has no new complaints except unable to lose weight.  She denies palpitations, sweating, tremors.   she denies hx of goiter, no neck surgery. She did have postpartum  thyroiditis.   Review of systems  Constitutional: + Minimally fluctuating body weight,  There is no height or weight on file to calculate BMI.. , + fatigue, no subjective hyperthermia, no subjective hypothermia Eyes: no blurry vision, no xerophthalmia ENT: no sore throat, no nodules palpated in throat, no dysphagia/odynophagia, no hoarseness Cardiovascular: no chest pain, no shortness of breath, no palpitations, no leg swelling Respiratory: no cough, no shortness of breath Gastrointestinal: no nausea/vomiting/diarrhea Musculoskeletal: no muscle/joint aches Skin: no rashes, no hyperemia Neurological: no tremors, no numbness, no tingling, no dizziness Psychiatric: no depression, no anxiety   Objective:    There were no vitals taken for this visit.  Wt Readings from Last 3 Encounters:  02/10/20 201 lb 9.6 oz (91.4 kg)  11/08/18 201 lb (91.2 kg)  11/05/18 201 lb (91.2 kg)    BP Readings from Last 3 Encounters:  02/10/20 112/70  11/08/18 128/72  11/05/18 (!) 146/84     Physical Exam- Telehealth- significantly limited due to nature of visit  Constitutional: There is no height or weight on file to calculate BMI. , not in acute distress, normal state of mind Respiratory: Adequate breathing efforts   Labs from Aug 16, 2017 showed free T3 2.3, free T4 104, TSH 3.52 (normal 0.35- 3.74)  April 12, 2018 labs free T4 0.86, TSH 1.9 2 Recent Results (from Meagan past 2160 hour(s))  TSH     Status: None   Collection Time: 06/29/20  9:54 AM  Result Value Ref Range   TSH 1.500 0.450 - 4.500 uIU/mL  T4, free     Status: None   Collection Time: 06/29/20  9:54 AM  Result Value Ref Range   Free T4 1.40 0.82 - 1.77 ng/dL    Results for CARROLYN, HILMES (MRN [id number]) as of 07/05/2020 08:50  Ref. Range 01/16/2019 00:00 02/09/2020 00:00 02/09/2020 00:00 02/10/2020 00:00 06/29/2020 09:54  TSH Latest Ref Range: 0.450 - 4.500 uIU/mL 1.230 1.65 1.65  1.500  T4,Free(Direct) Latest Ref Range:  0.82 - 1.77 ng/dL 1.47    1.40     Assessment & Plan:   1. Primary hypothyroidism  -Her previsit thyroid function tests are consistent with appropriate hormone replacement.  She is advised to continue Levothyroxine 137 mcg po daily before breakfast.     - We discussed about Meagan correct intake of her thyroid hormone, on empty stomach at fasting, with water, separated by at least 30 minutes from breakfast and other medications,  and separated by more than 4 hours from calcium, iron, multivitamins, acid reflux medications (PPIs). -Patient is made aware of Meagan fact that thyroid hormone replacement is needed for life, dose to be adjusted by periodic monitoring of thyroid function tests.  2. Vitamin D deficiency -Her most recent vitamin D level from 02/10/20 was 43.6.  She is advised to continue Vitamin D 3 5000 units daily as maintenance dose during Meagan winter months.  Will recheck vitamin d level prior to next visit.  - I advised patient to maintain close follow up with their PCP for  primary care needs.  3. 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  She describes Meagan symptoms as dizziness, sweatiness which resolve after eating and resting.  She does remember at least one occasion where it was after she had consumed more sugar than usual and that another time may have been due to meal timing.  This sounds like reactive hypoglycemia.  We discussed diet and exercise and Meagan need for her to obtain a glucometer to check her glucose readings when she has those feelings of low blood sugar.  I spent 46minutes dedicated to Meagan care of this patient on Meagan date of this encounter to include pre-visit review of records, face-to-face time with Meagan patient, and post visit ordering of  testing.    Follow up plan: Return in about 1 year (around 07/05/2021) for Thyroid follow up, Previsit labs.  Meagan Woods, Meagan Woods  Meagan Woods [address] [place], [place] [address] Phone: [phone] Fax: [phone]  07/05/2020, 1:42 PM

## 2020-07-05 NOTE — Patient Instructions (Signed)

## 2020-10-07 ENCOUNTER — Other Ambulatory Visit: Payer: Self-pay | Admitting: Nurse Practitioner

## 2021-01-09 ENCOUNTER — Other Ambulatory Visit: Payer: Self-pay | Admitting: Nurse Practitioner

## 2021-01-14 ENCOUNTER — Encounter: Payer: Self-pay | Admitting: Gastroenterology

## 2021-01-14 ENCOUNTER — Ambulatory Visit (INDEPENDENT_AMBULATORY_CARE_PROVIDER_SITE_OTHER): Payer: 59 | Admitting: Gastroenterology

## 2021-01-14 VITALS — BP 120/82 | HR 94 | Ht 61.0 in | Wt 208.0 lb

## 2021-01-14 DIAGNOSIS — R1310 Dysphagia, unspecified: Secondary | ICD-10-CM

## 2021-01-14 DIAGNOSIS — R109 Unspecified abdominal pain: Secondary | ICD-10-CM

## 2021-01-14 NOTE — Progress Notes (Signed)
HPI :  40 year old female known to me for history of dysphagia and globus, here for follow-up visit for abdominal/pelvic pain.  She was last seen in August 2020.  At the time of her last visit she had endorsed some ongoing dysphagia and globus sensation.  At that time I had recommended a trial of omeprazole 40 mg a day and subsequently she underwent an EGD.  EGD did not show any abnormalities.  Biopsies of her esophagus were normal.  I performed empiric dilation.  She states that resolved her dysphagia and symptoms and she has been doing well since that time without recurrence.  She reports over the past year she had a tubal ligation and uterine ablation with her gynecologist.  Following that intervention she has not had menstrual cycles.  Starting this past August at the end of the month, when she was normally due for her menstrual cycle, she developed severe mid lower abdominal to pelvic pain.  She states it felt much worse than typical menstrual cramps.  Sharp pain, rated 10 out of 10 on the first day, and eventually abated over a few days.  She was seen by the emergency department at [facility].  She had a white blood cell count of 11.9, normal hemoglobin.  Negative pelvic ultrasound and negative CT scan of the abdomen pelvis with exception of any renal stones lateral kidneys.  She also had normal LFTs, lipase.  She states she had a subsequent episode of exactly 1 month later at the end of September.  She again went to the emergency room and was told she had normal labs and no other invention was performed.  She was given NSAIDs which she states did help relieve her symptoms.  Unfortunately she had another episode earlier this week which lasted for 3 days.  She did not seek evaluation at this time and symptoms resolved with NSAIDs.  She has been seen by her gynecologist for this who thinks it may be related to her prior gynecologic surgery.  She was told she likely needs a hysterectomy but wanted to  come here for another opinion prior to doing so.  Between these episodes she has absolutely no symptoms.  She is eating well and feels well otherwise.  No reflux or dysphagia.  She has some nausea when she has these episodes but no vomiting.  His weight has been stable.  She did have some loose stools for several weeks starting in August but this stopped a few weeks ago and her bowel habits are normal.  She has no relation of her bowel habits to the symptoms.  No clear triggers that she can think of.  No blood in her stools.  Grandmother had gastric cancer, otherwise no family history of malignancies of the GI tract.   EGD 11/08/18 -  - A 1 cm hiatal hernia was present. - The exam of the esophagus was otherwise normal. No obvious stenosis / stricture. No obvious inflammatory changes. - A guidewire was placed and the scope was withdrawn. Empiric dilation was performed in the entire esophagus with a Savary dilator with mild resistance at 17 mm and 18 mm. Relook endoscopy showed no mucosal wrents. Biopsies were taken with a cold forceps in the upper third of the esophagus, in the middle third of the esophagus and in the lower third of the esophagus for histology. - The entire examined stomach was normal. - The duodenal bulb and second portion of the duodenum were normal.  Surgical [P], random  esophageal - BENIGN SQUAMOUS MUCOSA. - NO INCREASE IN INTRAEPITHELIAL EOSINOPHILS. - NO INTESTINAL METAPLASIA, DYSPLASIA, OR MALIGNANCY       Past Medical History:  Diagnosis Date   ADD (attention deficit disorder) without hyperactivity    Depression    Dysphagia    Headache    migraines   Hypothyroidism    Thyroiditis, autoimmune      Past Surgical History:  Procedure Laterality Date   CERVICAL ABLATION  2021   LYMPH NODE DISSECTION Right 2021   TUBAL LIGATION  2020   Family History  Problem Relation Age of Onset   Depression Mother    Cancer Mother    Hyperlipidemia Mother     Hypertension Father    High Cholesterol Father    Diabetes Father    Hypertension Brother    Ovarian cancer Maternal Grandmother    Uterine cancer Maternal Grandmother    Pancreatic cancer Paternal Grandmother    Diabetes Paternal Grandfather    Social History   Tobacco Use   Smoking status: Never   Smokeless tobacco: Never  Vaping Use   Vaping Use: Never used  Substance Use Topics   Alcohol use: No   Drug use: No   Current Outpatient Medications  Medication Sig Dispense Refill   ALPRAZolam (XANAX) 0.5 MG tablet Take 0.5 mg by mouth at bedtime as needed for anxiety.     amphetamine-dextroamphetamine (ADDERALL XR) 15 MG 24 hr capsule Adderall XR 15 mg capsule,extended release     Cholecalciferol (VITAMIN D3) 125 MCG (5000 UT) CAPS TAKE 1 CAPSULE BY MOUTH ONCE DAILY 90 capsule 0   levothyroxine (SYNTHROID) 137 MCG tablet TAKE 1 TABLET BY MOUTH ONCE DAILY BEFORE BREAKFAST 90 tablet 0   venlafaxine XR (EFFEXOR-XR) 150 MG 24 hr capsule Take 150 mg by mouth daily with breakfast.     Current Facility-Administered Medications  Medication Dose Route Frequency Provider Last Rate Last Admin   0.9 %  sodium chloride infusion  500 mL Intravenous Once Alfredo Collymore, Carlota Raspberry, MD       Allergies  Allergen Reactions   Sulfa Antibiotics Rash     Review of Systems: All systems reviewed and negative except where noted in HPI.    Labs per HPI  Physical Exam: BP 120/82   Pulse 94   Ht 5\' 1"  (1.549 m)   Wt 208 lb (94.3 kg)   BMI 39.30 kg/m  Constitutional: Pleasant,well-developed, female in no acute distress. Cardiovascular: Normal rate, regular rhythm.  Pulmonary/chest: Effort normal and breath sounds normal.  Abdominal: Soft, nondistended, nontender. There are no masses palpable.  Extremities: no edema Lymphadenopathy: No cervical adenopathy noted. Neurological: Alert and oriented to person place and time. Skin: Skin is warm and dry. No rashes noted. Psychiatric: Normal mood and  affect. Behavior is normal.   ASSESSMENT AND PLAN: 40 year old female here for reassessment of following:  Pelvic pain / lower abdominal pain Dysphagia  As above, severe pelvic pain /suprapubic occurring at the end of each of the last 3 months for 3 days or so with resolution without much intervention other than NSAIDs.  She has had a negative CT scan and pelvic ultrasound and labs look okay.  Her gynecologist has recommended hysterectomy and thinks related to prior gynecologic surgery.  As above she has no clear triggers, this would be very unusual to be related to her colon.  Given her cross-sectional imaging was normal at the time of significant pain, I think yield of colonoscopy or repeated imaging at  this point is likely low.  This does appear to correlate with timing of her prior menstrual cycles, I agree that she should follow-up with gynecology for this.  If they would like colonoscopy or other evaluation done prior to consideration for hysterectomy we can do that, but patient thinks their plan is to proceed directly to hysterectomy.  I am available to discuss with her gynecologist if need be.  She will need colonoscopy at age 83 but I do not feel strongly that she needs one now given the symptoms as she reports them today, would be very unusual for colonic pathology to present like this especially with normal CT scan.  Otherwise her dysphagia has resolved, she can follow-up with me as needed for these issues.  Jolly Mango, MD [facility]

## 2021-01-14 NOTE — Patient Instructions (Signed)
If you are age 40 or older, your body mass index should be between 23-30. Your Body mass index is 39.3 kg/m. If this is out of the aforementioned range listed, please consider follow up with your Primary Care Provider.  If you are age 71 or younger, your body mass index should be between 19-25. Your Body mass index is 39.3 kg/m. If this is out of the aformentioned range listed, please consider follow up with your Primary Care Provider.   ________________________________________________________  The [place] [facility] providers would like to encourage you to use [facility] to communicate with providers for non-urgent requests or questions.  Due to long hold times on the telephone, sending your provider a message by [facility] may be a faster and more efficient way to get a response.  Please allow 48 business hours for a response.  Please remember that this is for non-urgent requests.  _______________________________________________________

## 2021-05-02 ENCOUNTER — Other Ambulatory Visit: Payer: Self-pay | Admitting: Obstetrics and Gynecology

## 2021-06-15 ENCOUNTER — Other Ambulatory Visit: Payer: Self-pay

## 2021-06-15 DIAGNOSIS — E559 Vitamin D deficiency, unspecified: Secondary | ICD-10-CM

## 2021-06-15 DIAGNOSIS — E039 Hypothyroidism, unspecified: Secondary | ICD-10-CM

## 2021-07-05 ENCOUNTER — Ambulatory Visit: Payer: 59 | Admitting: Nurse Practitioner

## 2021-07-06 LAB — TSH: TSH: 3.21 u[IU]/mL (ref 0.450–4.500)

## 2021-07-06 LAB — T4, FREE: Free T4: 1.06 ng/dL (ref 0.82–1.77)

## 2021-07-06 LAB — VITAMIN D 25 HYDROXY (VIT D DEFICIENCY, FRACTURES): Vit D, 25-Hydroxy: 34 ng/mL (ref 30.0–100.0)

## 2021-07-08 ENCOUNTER — Ambulatory Visit (INDEPENDENT_AMBULATORY_CARE_PROVIDER_SITE_OTHER): Payer: 59 | Admitting: Nurse Practitioner

## 2021-07-08 ENCOUNTER — Encounter: Payer: Self-pay | Admitting: Nurse Practitioner

## 2021-07-08 VITALS — BP 127/83 | HR 82 | Ht 61.0 in | Wt 209.0 lb

## 2021-07-08 DIAGNOSIS — E039 Hypothyroidism, unspecified: Secondary | ICD-10-CM

## 2021-07-08 DIAGNOSIS — E559 Vitamin D deficiency, unspecified: Secondary | ICD-10-CM

## 2021-07-08 MED ORDER — LEVOTHYROXINE SODIUM 150 MCG PO TABS: 137.0000 ug | ORAL_TABLET | Freq: Every day | ORAL | 1 refills | Status: DC

## 2021-07-08 NOTE — Progress Notes (Signed)
? ?07/08/2021 ?    ?           Endocrinology Follow Up Note ? ? ?TELEHEALTH VISIT: The patient is being engaged in telehealth visit due to COVID-19.  This type of visit limits physical examination significantly, and thus is not preferable over face-to-face encounters. ? ?I connected with  Meagan Woods on 07/08/21 by a video enabled telemedicine application and verified that I am speaking with the correct person using two identifiers. ?  ?I discussed the limitations of evaluation and management by telemedicine. The patient expressed understanding and agreed to proceed.  ? ? ?The participants involved in this visit include: Brita Romp, NP located at [facility] and Iysis Germain  located at their personal residence listed. ? ? ? ? ?Subjective:  ? ? Patient ID: Meagan Woods, female    DOB: 1980-05-14, PCP Hungarland, Jenetta Downer, MD ? ? ?Past Medical History:  ?Diagnosis Date  ? ADD (attention deficit disorder) without hyperactivity   ? Depression   ? Dysphagia   ? Headache   ? migraines  ? Hypothyroidism   ? Thyroiditis, autoimmune   ? ?Past Surgical History:  ?Procedure Laterality Date  ? CERVICAL ABLATION  2021  ? LYMPH NODE DISSECTION Right 2021  ? TUBAL LIGATION  2020  ? ?Social History  ? ?Socioeconomic History  ? Marital status: Married  ?  Spouse name: Not on file  ? Number of children: Not on file  ? Years of education: Not on file  ? Highest education level: Not on file  ?Occupational History  ? Not on file  ?Tobacco Use  ? Smoking status: Never  ? Smokeless tobacco: Never  ?Vaping Use  ? Vaping Use: Never used  ?Substance and Sexual Activity  ? Alcohol use: No  ? Drug use: No  ? Sexual activity: Yes  ?Other Topics Concern  ? Not on file  ?Social History Narrative  ? Not on file  ? ?Social Determinants of Health  ? ?Financial Resource Strain: Not on file  ?Food Insecurity: Not on file  ?Transportation Needs: Not on file  ?Physical Activity: Not on file  ?Stress: Not on file   ?Social Connections: Not on file  ? ?Outpatient Encounter Medications as of 07/08/2021  ?Medication Sig  ? ALPRAZolam (XANAX) 0.5 MG tablet Take 0.5 mg by mouth at bedtime as needed for anxiety.  ? amphetamine-dextroamphetamine (ADDERALL XR) 15 MG 24 hr capsule Adderall XR 15 mg capsule,extended release  ? Cholecalciferol (VITAMIN D3) 125 MCG (5000 UT) CAPS TAKE 1 CAPSULE BY MOUTH ONCE DAILY  ? levothyroxine (SYNTHROID) 150 MCG tablet Take 1 tablet (150 mcg total) by mouth daily before breakfast.  ? venlafaxine XR (EFFEXOR-XR) 150 MG 24 hr capsule Take 150 mg by mouth daily with breakfast.  ? [DISCONTINUED] levothyroxine (SYNTHROID) 137 MCG tablet TAKE 1 TABLET BY MOUTH ONCE DAILY BEFORE BREAKFAST  ? ?Facility-Administered Encounter Medications as of 07/08/2021  ?Medication  ? 0.9 %  sodium chloride infusion  ? ?ALLERGIES: ?Allergies  ?Allergen Reactions  ? Sulfa Antibiotics Rash  ? ?VACCINATION STATUS: ?Immunization History  ?Administered Date(s) Administered  ? Influenza-Unspecified 12/18/2013  ? Tdap 01/14/2014  ? ? ?Thyroid Problem ?Presents for follow-up visit. Symptoms include fatigue. Patient reports no anxiety, cold intolerance, constipation, depressed mood, diarrhea, heat intolerance, palpitations, tremors, weight gain or weight loss. The symptoms have been stable.  ?Hypoglycemia ?This is a recurrent problem. The current episode started more than 1 month ago. The problem occurs intermittently. The problem  has been waxing and waning. Associated symptoms include fatigue. Associated symptoms comments: Dizziness, sweaty. Exacerbated by: consuming more sugar than usual, meal timing. She has tried eating for the symptoms. The treatment provided moderate relief.  ? ?She has no new complaints except unable to lose weight.  ?She denies palpitations, sweating, tremors.  ? ?she denies hx of goiter, no neck surgery. She did have postpartum thyroiditis. ? ? ?Review of systems ? ?Constitutional: + slowly increasing body  weight,  Body mass index is 39.49 kg/m?Marland Kitchen. , + fatigue, no subjective hyperthermia, no subjective hypothermia ?Eyes: no blurry vision, no xerophthalmia ?ENT: no sore throat, no nodules palpated in throat, no dysphagia/odynophagia, no hoarseness ?Cardiovascular: no chest pain, no shortness of breath, no palpitations, no leg swelling ?Respiratory: no cough, no shortness of breath ?Gastrointestinal: no nausea/vomiting/diarrhea ?Musculoskeletal: no muscle/joint aches ?Skin: no rashes, no hyperemia ?Neurological: no tremors, no numbness, no tingling, no dizziness ?Psychiatric: no depression, no anxiety ? ? ?Objective:  ?  ?BP 127/83   Pulse 82   Ht '5\' 1"'$  (1.549 m)   Wt 209 lb (94.8 kg)   BMI 39.49 kg/m?   ?Wt Readings from Last 3 Encounters:  ?07/08/21 209 lb (94.8 kg)  ?01/14/21 208 lb (94.3 kg)  ?02/10/20 201 lb 9.6 oz (91.4 kg)  ?  ?BP Readings from Last 3 Encounters:  ?07/08/21 127/83  ?01/14/21 120/82  ?02/10/20 112/70  ? ? ?Physical Exam- Limited ? ?Constitutional:  Body mass index is 39.49 kg/m?. , not in acute distress, normal state of mind ?Eyes:  EOMI, no exophthalmos ?Neck: Supple ?Cardiovascular: RRR, no murmurs, rubs, or gallops, no edema ?Respiratory: Adequate breathing efforts, no crackles, rales, rhonchi, or wheezing ?Musculoskeletal: no gross deformities, strength intact in all four extremities, no gross restriction of joint movements ?Skin:  no rashes, no hyperemia ?Neurological: no tremor with outstretched hands ? ? ?April 12, 2018 labs free T4 0.86, TSH 1.9 2 ?Recent Results (from the past 2160 hour(s))  ?Vitamin D, 25-hydroxy     Status: None  ? Collection Time: 07/05/21 12:09 PM  ?Result Value Ref Range  ? Vit D, 25-Hydroxy 34.0 30.0 - 100.0 ng/mL  ?  Comment: Vitamin D deficiency has been defined by the Institute of ?Medicine and an Endocrine Society practice guideline as a ?level of serum 25-OH vitamin D less than 20 ng/mL (1,2). ?The Endocrine Society went on to further define vitamin  D ?insufficiency as a level between 21 and 29 ng/mL (2). ?1. IOM (Institute of Medicine). 2010. Dietary reference ?   intakes for calcium and D. [place]: The ?   [employer]. ?2. Holick MF, Binkley Parker, Bischoff-Ferrari HA, et al. ?   Evaluation, treatment, and prevention of vitamin D ?   deficiency: an Endocrine Society clinical practice ?   guideline. JCEM. 2011 Jul; 96(7):1911-30. ?  ?T4, Free     Status: None  ? Collection Time: 07/05/21 12:09 PM  ?Result Value Ref Range  ? Free T4 1.06 0.82 - 1.77 ng/dL  ?TSH     Status: None  ? Collection Time: 07/05/21 12:09 PM  ?Result Value Ref Range  ? TSH 3.210 0.450 - 4.500 uIU/mL  ? ? ? Latest Reference Range & Units 04/10/17 00:00 01/16/19 00:00 02/09/20 00:00 06/29/20 09:54 07/05/21 12:09  ?TSH 0.450 - 4.500 uIU/mL 0.01 ! (E) 1.230 1.65 (E) ?1.65 (E) 1.500 3.210  ?T4,Free(Direct) 0.82 - 1.77 ng/dL  1.47  1.40 1.06  ?!: Data is abnormal ?(E): External lab result ? ? ?  Assessment & Plan:  ? ?1. Primary hypothyroidism  ?-Her previsit thyroid function tests are consistent with slightly under replacement.  She is advised to increase her Levothyroxine  to 150 mcg po daily before breakfast.  ? ?  - We discussed about the correct intake of her thyroid hormone, on empty stomach at fasting, with water, separated by at least 30 minutes from breakfast and other medications,  and separated by more than 4 hours from calcium, iron, multivitamins, acid reflux medications (PPIs). ?-Patient is made aware of the fact that thyroid hormone replacement is needed for life, dose to be adjusted by periodic monitoring of thyroid function tests. ? ?2. Vitamin D deficiency ?-Her most recent vitamin D level from 07/05/21 was 34.  She is advised to continue Vitamin D 3 5000 units daily as maintenance dose during the winter months.  Will recheck vitamin d level prior to next visit. ? ?- I advised patient to maintain close follow up with their PCP for primary care needs. ? ?3.  Reactive Hypoglycemia-suspected ?-Says she has had several incidences of feeling like she has low blood sugars.  She describes the symptoms as dizziness, sweatiness which resolve after eating and resting.  She does reme

## 2021-07-08 NOTE — Patient Instructions (Signed)

## 2021-09-06 ENCOUNTER — Encounter: Payer: Self-pay | Admitting: Nurse Practitioner

## 2021-09-29 LAB — TSH: TSH: 2.32 (ref 0.41–5.90)

## 2021-11-04 ENCOUNTER — Telehealth: Payer: Self-pay | Admitting: Nurse Practitioner

## 2021-11-04 NOTE — Telephone Encounter (Signed)
Lft msg for pt to let us know where she had her labs done. Last labs in chart are from April 2023

## 2021-11-04 NOTE — Telephone Encounter (Signed)
Faxed order

## 2021-11-04 NOTE — Telephone Encounter (Signed)
New message    Patient is asking for lab results to be fax to her job   Job fax # [phone]

## 2021-11-07 ENCOUNTER — Telehealth: Payer: 59 | Admitting: Nurse Practitioner

## 2021-11-22 ENCOUNTER — Telehealth: Payer: 59 | Admitting: Nurse Practitioner

## 2022-01-20 ENCOUNTER — Telehealth: Payer: Self-pay | Admitting: Nurse Practitioner

## 2022-01-20 NOTE — Telephone Encounter (Signed)
Patient schedule her f/u for 11/21, can you use labs from sept or want more?

## 2022-01-23 NOTE — Telephone Encounter (Signed)
I called patient and left a detailed voicemail.

## 2022-01-23 NOTE — Telephone Encounter (Signed)
She will need to have her thyroid labs repeated since I increased her dosage last visit.

## 2022-01-31 LAB — TSH: TSH: 2.41 (ref 0.41–5.90)

## 2022-01-31 LAB — COMPREHENSIVE METABOLIC PANEL
Albumin: 4.3 (ref 3.5–5.0)
Calcium: 9.2 (ref 8.7–10.7)

## 2022-01-31 LAB — BASIC METABOLIC PANEL
BUN: 11 (ref 4–21)
CO2: 24 — AB (ref 13–22)
Chloride: 100 (ref 99–108)
Creatinine: 0.8 (ref 0.5–1.1)
Glucose: 91
Potassium: 3.6 mEq/L (ref 3.5–5.1)
Sodium: 140 (ref 137–147)

## 2022-01-31 LAB — HEPATIC FUNCTION PANEL
ALT: 19 U/L (ref 7–35)
AST: 18 (ref 13–35)
Alkaline Phosphatase: 84 (ref 25–125)
Bilirubin, Total: 0.3

## 2022-01-31 LAB — CBC AND DIFFERENTIAL
HCT: 43 (ref 36–46)
Hemoglobin: 13.8 (ref 12.0–16.0)
WBC: 15.9

## 2022-01-31 LAB — VITAMIN D 25 HYDROXY (VIT D DEFICIENCY, FRACTURES): Vit D, 25-Hydroxy: 31.3

## 2022-01-31 LAB — VITAMIN B12: Vitamin B-12: 522

## 2022-01-31 LAB — CBC: RBC: 4.96 (ref 3.87–5.11)

## 2022-02-06 NOTE — Patient Instructions (Signed)

## 2022-02-07 ENCOUNTER — Ambulatory Visit (INDEPENDENT_AMBULATORY_CARE_PROVIDER_SITE_OTHER): Payer: 59 | Admitting: Nurse Practitioner

## 2022-02-07 ENCOUNTER — Encounter: Payer: Self-pay | Admitting: Nurse Practitioner

## 2022-02-07 ENCOUNTER — Encounter: Payer: Self-pay | Admitting: "Endocrinology

## 2022-02-07 VITALS — Ht 61.0 in | Wt 207.0 lb

## 2022-02-07 DIAGNOSIS — E039 Hypothyroidism, unspecified: Secondary | ICD-10-CM

## 2022-02-07 DIAGNOSIS — E559 Vitamin D deficiency, unspecified: Secondary | ICD-10-CM

## 2022-02-07 DIAGNOSIS — E6609 Other obesity due to excess calories: Secondary | ICD-10-CM | POA: Diagnosis not present

## 2022-02-07 DIAGNOSIS — Z6839 Body mass index (BMI) 39.0-39.9, adult: Secondary | ICD-10-CM

## 2022-02-07 MED ORDER — WEGOVY 0.5 MG/0.5ML ~~LOC~~ SOAJ: 0.5000 mg | SUBCUTANEOUS | 0 refills | Status: DC

## 2022-02-07 MED ORDER — ACCU-CHEK GUIDE W/DEVICE KIT: PACK | 0 refills | Status: DC

## 2022-02-07 MED ORDER — VITAMIN D (ERGOCALCIFEROL) 1.25 MG (50000 UNIT) PO CAPS: 50000.0000 [IU] | ORAL_CAPSULE | ORAL | 0 refills | Status: DC

## 2022-02-07 MED ORDER — ACCU-CHEK MULTICLIX LANCETS MISC: 12 refills | Status: DC

## 2022-02-07 MED ORDER — WEGOVY 1 MG/0.5ML ~~LOC~~ SOAJ: 1.0000 mg | SUBCUTANEOUS | 0 refills | Status: DC

## 2022-02-07 MED ORDER — WEGOVY 0.25 MG/0.5ML ~~LOC~~ SOAJ: 0.2500 mg | SUBCUTANEOUS | 0 refills | Status: DC

## 2022-02-07 MED ORDER — ACCU-CHEK GUIDE VI STRP: ORAL_STRIP | 12 refills | Status: DC

## 2022-02-07 NOTE — Progress Notes (Signed)
02/07/2022                Endocrinology Follow Up Note   TELEHEALTH VISIT: The patient is being engaged in telehealth visit.  This type of visit limits physical examination significantly, and thus is not preferable over face-to-face encounters.  I connected with  Meagan Woods on 02/07/22 by a video enabled telemedicine application and verified that I am speaking with the correct person using two identifiers.   I discussed the limitations of evaluation and management by telemedicine. The patient expressed understanding and agreed to proceed.    The participants involved in this visit include: Brita Romp, NP located at [facility] and Meagan Woods  located at their personal residence listed.  Subjective:    Patient ID: Meagan Woods, female    DOB: [birth year], PCP Hungarland, Jenetta Downer, MD   Past Medical History:  Diagnosis Date   ADD (attention deficit disorder) without hyperactivity    Depression    Dysphagia    Headache    migraines   Hypothyroidism    Thyroiditis, autoimmune    Past Surgical History:  Procedure Laterality Date   CERVICAL ABLATION  2021   LYMPH NODE DISSECTION Right 2021   TUBAL LIGATION  2020   Social History   Socioeconomic History   Marital status: Married    Spouse name: Not on file   Number of children: Not on file   Years of education: Not on file   Highest education level: Not on file  Occupational History   Not on file  Tobacco Use   Smoking status: Never   Smokeless tobacco: Never  Vaping Use   Vaping Use: Never used  Substance and Sexual Activity   Alcohol use: No   Drug use: No   Sexual activity: Yes  Other Topics Concern   Not on file  Social History Narrative   Not on file   Social Determinants of Health   Financial Resource Strain: Not on file  Food Insecurity: Not on file  Transportation Needs: Not on file  Physical Activity: Not on file  Stress: Not on file  Social Connections:  Not on file   Outpatient Encounter Medications as of 02/07/2022  Medication Sig   amphetamine-dextroamphetamine (ADDERALL XR) 15 MG 24 hr capsule Adderall XR 15 mg capsule,extended release   Blood Glucose Monitoring Suppl (ACCU-CHEK GUIDE) w/Device KIT Use to monitor glucose twice daily   Cholecalciferol (VITAMIN D3) 125 MCG (5000 UT) CAPS TAKE 1 CAPSULE BY MOUTH ONCE DAILY   glucose blood (ACCU-CHEK GUIDE) test strip Use as instructed to monitor glucose twice daily   Lancets (ACCU-CHEK MULTICLIX) lancets Use as instructed to monitor glucose twice daily   levothyroxine (SYNTHROID) 150 MCG tablet Take 1 tablet (150 mcg total) by mouth daily before breakfast.   Semaglutide-Weight Management (WEGOVY) 0.25 MG/0.5ML SOAJ Inject 0.25 mg into the skin once a week.   Semaglutide-Weight Management (WEGOVY) 0.5 MG/0.5ML SOAJ Inject 0.5 mg into the skin once a week.   Semaglutide-Weight Management (WEGOVY) 1 MG/0.5ML SOAJ Inject 1 mg into the skin once a week.   venlafaxine XR (EFFEXOR-XR) 150 MG 24 hr capsule Take 150 mg by mouth daily with breakfast.   Vitamin D, Ergocalciferol, (DRISDOL) 1.25 MG (50000 UNIT) CAPS capsule Take 1 capsule (50,000 Units total) by mouth every 7 (seven) days.   ALPRAZolam (XANAX) 0.5 MG tablet Take 0.5 mg by mouth at bedtime as needed for anxiety. (Patient not taking: Reported on 02/07/2022)  Facility-Administered Encounter Medications as of 02/07/2022  Medication   0.9 %  sodium chloride infusion   ALLERGIES: Allergies  Allergen Reactions   Sulfa Antibiotics Rash   VACCINATION STATUS: Immunization History  Administered Date(s) Administered   Influenza-Unspecified 12/18/2013   Tdap 01/14/2014    Thyroid Problem Presents for follow-up visit. Symptoms include fatigue. Patient reports no anxiety, cold intolerance, constipation, depressed mood, diarrhea, heat intolerance, palpitations, tremors, weight gain or weight loss. The symptoms have been stable.   Hypoglycemia This is a recurrent problem. The current episode started more than 1 month ago. The problem occurs intermittently. The problem has been waxing and waning. Associated symptoms include fatigue. Associated symptoms comments: Dizziness, sweaty. Exacerbated by: consuming more sugar than usual, meal timing. She has tried eating for the symptoms. The treatment provided moderate relief.    She has no new complaints except unable to lose weight.  She denies palpitations, sweating, tremors.   she denies hx of goiter, no neck surgery. She did have postpartum thyroiditis.   Review of systems  Constitutional: + stable body weight,  Body mass index is 39.11 kg/m.. , + fatigue, no subjective hyperthermia, no subjective hypothermia Eyes: no blurry vision, no xerophthalmia ENT: no sore throat, no nodules palpated in throat, no dysphagia/odynophagia, no hoarseness Cardiovascular: no chest pain, no shortness of breath, no palpitations, no leg swelling Respiratory: no cough, no shortness of breath Gastrointestinal: no nausea/vomiting/diarrhea Musculoskeletal: no muscle/joint aches Skin: no rashes, no hyperemia, + hair loss Neurological: no tremors, no numbness, no tingling, no dizziness Psychiatric: no depression, no anxiety   Objective:    Ht _0  (1.549 m)   Wt 207 lb (93.9 kg) Comment: Per Patient  BMI 39.11 kg/m   Wt Readings from Last 3 Encounters:  02/07/22 207 lb (93.9 kg)  07/08/21 209 lb (94.8 kg)  01/14/21 208 lb (94.3 kg)    BP Readings from Last 3 Encounters:  07/08/21 127/83  01/14/21 120/82  02/10/20 112/70    Physical Exam- Limited  Constitutional:  Body mass index is 39.11 kg/m. , not in acute distress, normal state of mind Eyes:  EOMI, no exophthalmos Neck: Supple Cardiovascular: RRR, no murmurs, rubs, or gallops, no edema Respiratory: Adequate breathing efforts, no crackles, rales, rhonchi, or wheezing Musculoskeletal: no gross deformities, strength  intact in all four extremities, no gross restriction of joint movements Skin:  no rashes, no hyperemia Neurological: no tremor with outstretched hands   April 12, 2018 labs free T4 0.86, TSH 1.9 2 Recent Results (from the past 2160 hour(s))  CBC and differential     Status: None   Collection Time: 01/31/22 12:00 AM  Result Value Ref Range   Hemoglobin 13.8 12.0 - 16.0   HCT 43 36 - 46   WBC 15.9   CBC     Status: None   Collection Time: 01/31/22 12:00 AM  Result Value Ref Range   RBC 4.96 3.87 - 5.11  VITAMIN D 25 Hydroxy (Vit-D Deficiency, Fractures)     Status: None   Collection Time: 01/31/22 12:00 AM  Result Value Ref Range   Vit D, 25-Hydroxy 42.3   Basic metabolic panel     Status: Abnormal   Collection Time: 01/31/22 12:00 AM  Result Value Ref Range   Glucose 91    BUN 11 4 - 21   CO2 24 (A) 13 - 22   Creatinine 0.8 0.5 - 1.1   Potassium 3.6 3.5 - 5.1 mEq/L   Sodium 140 137 -  147   Chloride 100 99 - 108  Comprehensive metabolic panel     Status: None   Collection Time: 01/31/22 12:00 AM  Result Value Ref Range   Calcium 9.2 8.7 - 10.7   Albumin 4.3 3.5 - 5.0  Hepatic function panel     Status: None   Collection Time: 01/31/22 12:00 AM  Result Value Ref Range   Alkaline Phosphatase 84 25 - 125   ALT 19 7 - 35 U/L   AST 18 13 - 35   Bilirubin, Total 0.3   Vitamin B12     Status: None   Collection Time: 01/31/22 12:00 AM  Result Value Ref Range   Vitamin B-12 522   TSH     Status: None   Collection Time: 01/31/22 12:00 AM  Result Value Ref Range   TSH 2.41 0.41 - 5.90    Comment: Free T4 1.29     Latest Reference Range & Units 01/16/19 00:00 02/09/20 00:00 06/29/20 09:54 07/05/21 12:09 09/29/21 00:00 01/31/22 00:00  TSH 0.41 - 5.90  1.230 1.65 (E) 1.65 (E) 1.500 3.210 2.32 (E) 2.41 (E)  T4,Free(Direct) 0.82 - 1.77 ng/dL 1.47  1.40 1.06    (E): External lab result   Assessment & Plan:   1. Primary hypothyroidism  -Her previsit thyroid function  tests are consistent with slight under-replacement.  She is advised to increase her Levothyroxine to 150 mcg po daily before breakfast.     - We discussed about the correct intake of her thyroid hormone, on empty stomach at fasting, with water, separated by at least 30 minutes from breakfast and other medications,  and separated by more than 4 hours from calcium, iron, multivitamins, acid reflux medications (PPIs). -Patient is made aware of the fact that thyroid hormone replacement is needed for life, dose to be adjusted by periodic monitoring of thyroid function tests.  2. Vitamin D deficiency -Her most recent vitamin D level from 01/31/22 was 31.3.  I discussed and prescribed Ergocalciferol 50000 units weekly in addition to her OTC Vitamin D 3 5000 units daily.    - I advised patient to maintain close follow up with their PCP for primary care needs.  3. 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  She describes the symptoms as dizziness, sweatiness which resolve after eating and resting.  She does remember at least one occasion where it was after she had consumed more sugar than usual and that another time may have been due to meal timing.  This sounds like reactive hypoglycemia.  She continues to have episodes of this but does not have a glucose meter to check.  I did send in script for glucose meter for her today.  The following Lifestyle Medicine recommendations according to [facility]) were discussed and offered to patient and she agrees to start the journey:  A. Whole Foods, Plant-based plate comprising of fruits and vegetables, plant-based proteins, whole-grain carbohydrates was discussed in detail with the patient.   A list for source of those nutrients were also provided to the patient.  Patient will use only water or unsweetened tea for hydration. B.  The need to stay away from risky  substances including alcohol, smoking; obtaining 7 to 9 hours of restorative sleep, at least 150 minutes of moderate intensity exercise weekly, the importance of healthy social connections,  and stress reduction techniques were discussed. C.  A full color page of  Calorie density of various food groups per pound showing examples of each food groups was provided to the patient.  4. Obesity She is interested in aide to help her lose weight and would like to try [facility].  We have been over diet recommendations in the past.  I sent in for Wegovy 0.25 mg SQ weekly x 1 month, 0.5 mg SQ weekly x 1 month, and 1 mg SQ weekly x 1 month.  Will continue to step her up as tolerated until desired results reached.  She does not have personal or family history of thyroid cancer, has never had pancreatitis, does not have elevated triglycerides which makes he a candidate for GLP1 therapy.    I spent 35 minutes dedicated to the care of this patient on the date of this encounter to include pre-visit review of records, face-to-face time with the patient, and post visit ordering of testing.  Meagan Woods  participated in the discussions, expressed understanding, and voiced agreement with the above plans.  All questions were answered to her satisfaction. she is encouraged to contact clinic should she have any questions or concerns prior to her return visit.    Follow up plan: Return in about 4 months (around 06/08/2022) for Thyroid follow up, Virtual visit ok, Previsit labs.  Rayetta Pigg, [facility] [address] [place], [place] [address] Phone: [phone] Fax: [phone]  02/07/2022, 3:34 PM

## 2022-02-15 ENCOUNTER — Encounter: Payer: Self-pay | Admitting: Nurse Practitioner

## 2022-02-15 MED ORDER — ACCU-CHEK GUIDE VI STRP: ORAL_STRIP | 12 refills | Status: DC

## 2022-06-01 LAB — TSH: TSH: 1.56 u[IU]/mL (ref 0.450–4.500)

## 2022-06-01 LAB — T3, FREE: T3, Free: 3 pg/mL (ref 2.0–4.4)

## 2022-06-01 LAB — T4, FREE: Free T4: 1.32 ng/dL (ref 0.82–1.77)

## 2022-06-07 NOTE — Patient Instructions (Signed)

## 2022-06-08 ENCOUNTER — Encounter: Payer: Self-pay | Admitting: Nurse Practitioner

## 2022-06-08 ENCOUNTER — Ambulatory Visit (INDEPENDENT_AMBULATORY_CARE_PROVIDER_SITE_OTHER): Payer: 59 | Admitting: Nurse Practitioner

## 2022-06-08 VITALS — BP 121/84 | HR 98 | Ht 61.0 in | Wt 212.4 lb

## 2022-06-08 DIAGNOSIS — E559 Vitamin D deficiency, unspecified: Secondary | ICD-10-CM

## 2022-06-08 DIAGNOSIS — E039 Hypothyroidism, unspecified: Secondary | ICD-10-CM

## 2022-06-08 MED ORDER — ZEPBOUND 5 MG/0.5ML ~~LOC~~ SOAJ: 5.0000 mg | SUBCUTANEOUS | 3 refills | Status: DC

## 2022-06-08 MED ORDER — ZEPBOUND 2.5 MG/0.5ML ~~LOC~~ SOAJ: 2.5000 mg | SUBCUTANEOUS | 0 refills | Status: DC

## 2022-06-08 MED ORDER — LEVOTHYROXINE SODIUM 150 MCG PO TABS: 137.0000 ug | ORAL_TABLET | Freq: Every day | ORAL | 1 refills | Status: DC

## 2022-06-08 NOTE — Progress Notes (Signed)
06/08/2022                Endocrinology Follow Up Note   Subjective:    Patient ID: Meagan Woods, female    DOB: June 29, 1980, PCP Hungarland, Jenetta Downer, MD   Past Medical History:  Diagnosis Date   ADD (attention deficit disorder) without hyperactivity    Depression    Dysphagia    Headache    migraines   Hypothyroidism    Thyroiditis, autoimmune    Past Surgical History:  Procedure Laterality Date   CERVICAL ABLATION  2021   LYMPH NODE DISSECTION Right 2021   TUBAL LIGATION  2020   Social History   Socioeconomic History   Marital status: Married    Spouse name: Not on file   Number of children: Not on file   Years of education: Not on file   Highest education level: Not on file  Occupational History   Not on file  Tobacco Use   Smoking status: Never   Smokeless tobacco: Never  Vaping Use   Vaping Use: Never used  Substance and Sexual Activity   Alcohol use: No   Drug use: No   Sexual activity: Yes  Other Topics Concern   Not on file  Social History Narrative   Not on file   Social Determinants of Health   Financial Resource Strain: Not on file  Food Insecurity: Not on file  Transportation Needs: Not on file  Physical Activity: Not on file  Stress: Not on file  Social Connections: Not on file   Outpatient Encounter Medications as of 06/08/2022  Medication Sig   ALPRAZolam (XANAX) 0.5 MG tablet Take 0.5 mg by mouth at bedtime as needed for anxiety.   amphetamine-dextroamphetamine (ADDERALL XR) 15 MG 24 hr capsule Adderall XR 15 mg capsule,extended release   Blood Glucose Monitoring Suppl (ACCU-CHEK GUIDE) w/Device KIT Use to monitor glucose twice daily   Cholecalciferol (VITAMIN D3) 125 MCG (5000 UT) CAPS TAKE 1 CAPSULE BY MOUTH ONCE DAILY   glucose blood (ACCU-CHEK GUIDE) test strip Use as instructed to monitor glucose twice daily   Lancets (ACCU-CHEK MULTICLIX) lancets Use as instructed to monitor glucose twice daily   tirzepatide (ZEPBOUND) 2.5  MG/0.5ML Pen Inject 2.5 mg into the skin once a week.   tirzepatide (ZEPBOUND) 5 MG/0.5ML Pen Inject 5 mg into the skin once a week.   venlafaxine XR (EFFEXOR-XR) 150 MG 24 hr capsule Take 150 mg by mouth daily with breakfast.   [DISCONTINUED] levothyroxine (SYNTHROID) 150 MCG tablet Take 1 tablet (150 mcg total) by mouth daily before breakfast.   levothyroxine (SYNTHROID) 150 MCG tablet Take 1 tablet (150 mcg total) by mouth daily before breakfast.   Vitamin D, Ergocalciferol, (DRISDOL) 1.25 MG (50000 UNIT) CAPS capsule Take 1 capsule (50,000 Units total) by mouth every 7 (seven) days. (Patient not taking: Reported on 06/08/2022)   [DISCONTINUED] Semaglutide-Weight Management (WEGOVY) 0.25 MG/0.5ML SOAJ Inject 0.25 mg into the skin once a week. (Patient not taking: Reported on 06/08/2022)   [DISCONTINUED] Semaglutide-Weight Management (WEGOVY) 0.5 MG/0.5ML SOAJ Inject 0.5 mg into the skin once a week. (Patient not taking: Reported on 06/08/2022)   [DISCONTINUED] Semaglutide-Weight Management (WEGOVY) 1 MG/0.5ML SOAJ Inject 1 mg into the skin once a week. (Patient not taking: Reported on 06/08/2022)   Facility-Administered Encounter Medications as of 06/08/2022  Medication   0.9 %  sodium chloride infusion   ALLERGIES: Allergies  Allergen Reactions   Sulfa Antibiotics Rash   VACCINATION STATUS: Immunization  History  Administered Date(s) Administered   Influenza-Unspecified 12/18/2013   Tdap 01/14/2014    Thyroid Problem Presents for follow-up visit. Symptoms include fatigue. Patient reports no anxiety, cold intolerance, constipation, depressed mood, diarrhea, heat intolerance, palpitations, tremors, weight gain or weight loss. The symptoms have been stable.  Hypoglycemia This is a recurrent problem. The current episode started more than 1 month ago. The problem occurs intermittently. The problem has been waxing and waning. Associated symptoms include fatigue. Associated symptoms comments:  Dizziness, sweaty. Exacerbated by: consuming more sugar than usual, meal timing. She has tried eating for the symptoms. The treatment provided moderate relief.   she denies hx of goiter, no neck surgery. She did have postpartum thyroiditis.   Review of systems  Constitutional: + Minimally fluctuating body weight,  current Body mass index is 40.13 kg/m. , no fatigue, no subjective hyperthermia, no subjective hypothermia Eyes: no blurry vision, no xerophthalmia ENT: no sore throat, no nodules palpated in throat, no dysphagia/odynophagia, no hoarseness Cardiovascular: no chest pain, no shortness of breath, no palpitations, no leg swelling Respiratory: no cough, no shortness of breath Gastrointestinal: no nausea/vomiting/diarrhea Musculoskeletal: no muscle/joint aches Skin: no rashes, no hyperemia Neurological: no tremors, no numbness, no tingling, no dizziness Psychiatric: no depression, no anxiety   Objective:    BP 121/84 (BP Location: Left Arm, Patient Position: Sitting, Cuff Size: Large)   Pulse 98   Ht 5\' 1"  (1.549 m)   Wt 212 lb 6.4 oz (96.3 kg)   BMI 40.13 kg/m   Wt Readings from Last 3 Encounters:  06/08/22 212 lb 6.4 oz (96.3 kg)  02/07/22 207 lb (93.9 kg)  07/08/21 209 lb (94.8 kg)    BP Readings from Last 3 Encounters:  06/08/22 121/84  07/08/21 127/83  01/14/21 120/82    Physical Exam- Limited  Constitutional:  Body mass index is 40.13 kg/m. , not in acute distress, normal state of mind Eyes:  EOMI, no exophthalmos Musculoskeletal: no gross deformities, strength intact in all four extremities, no gross restriction of joint movements Skin:  no rashes, no hyperemia Neurological: no tremor with outstretched hands   April 12, 2018 labs free T4 0.86, TSH 1.9 2 Recent Results (from the past 2160 hour(s))  TSH     Status: None   Collection Time: 05/31/22 11:20 AM  Result Value Ref Range   TSH 1.560 0.450 - 4.500 uIU/mL  T4, free     Status: None    Collection Time: 05/31/22 11:20 AM  Result Value Ref Range   Free T4 1.32 0.82 - 1.77 ng/dL  T3, free     Status: None   Collection Time: 05/31/22 11:20 AM  Result Value Ref Range   T3, Free 3.0 2.0 - 4.4 pg/mL     Latest Reference Range & Units 06/29/20 09:54 07/05/21 12:09 09/29/21 00:00 01/31/22 00:00 05/31/22 11:20  TSH 0.450 - 4.500 uIU/mL 1.500 3.210 2.32 (E) 2.41 (E) 1.560  Triiodothyronine,Free,Serum 2.0 - 4.4 pg/mL     3.0  T4,Free(Direct) 0.82 - 1.77 ng/dL 1.40 1.06   1.32  (E): External lab result   Assessment & Plan:   1. Primary hypothyroidism  -Her previsit thyroid function tests are consistent with appropriate hormone replacement.  She is advised to continue her Levothyroxine 150 mcg po daily before breakfast.     - We discussed about the correct intake of her thyroid hormone, on empty stomach at fasting, with water, separated by at least 30 minutes from breakfast and other medications,  and  separated by more than 4 hours from calcium, iron, multivitamins, acid reflux medications (PPIs). -Patient is made aware of the fact that thyroid hormone replacement is needed for life, dose to be adjusted by periodic monitoring of thyroid function tests.  2. Vitamin D deficiency -Her most recent vitamin D level from 01/31/22 was 31.3.  I discussed and prescribed Ergocalciferol 50000 units weekly in addition to her OTC Vitamin D 3 5000 units daily at last visit.  Will recheck vitamin D prior to next visit.    3. 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  She describes the symptoms as dizziness, sweatiness which resolve after eating and resting.  She does remember at least one occasion where it was after she had consumed more sugar than usual and that another time may have been due to meal timing.  This sounds like reactive hypoglycemia.  She continues to have episodes of this but does not have a glucose meter to check.     The following Lifestyle Medicine recommendations according to [facility]) were discussed and offered to patient and she agrees to start the journey:  A. Whole Foods, Plant-based plate comprising of fruits and vegetables, plant-based proteins, whole-grain carbohydrates was discussed in detail with the patient.   A list for source of those nutrients were also provided to the patient.  Patient will use only water or unsweetened tea for hydration. B.  The need to stay away from risky substances including alcohol, smoking; obtaining 7 to 9 hours of restorative sleep, at least 150 minutes of moderate intensity exercise weekly, the importance of healthy social connections,  and stress reduction techniques were discussed. C.  A full color page of  Calorie density of various food groups per pound showing examples of each food groups was provided to the patient.  4. Obesity She is interested in aide to help her lose weight.  I had previously sent in [facility] for her but it was never approved for unknown reasons.  I sent in for Zepbound 2.5 mg SQ weekly x 1 month, then increase to the 5 mg SQ weekly thereafter.  This will most likely help with reactive hypoglycemia as well.  I do anticipate needing to do a PA for this.   - I advised patient to maintain close follow up with their PCP for primary care needs.   I spent  20  minutes in the care of the patient today including review of labs from Thyroid Function, CMP, and other relevant labs ; imaging/biopsy records (current and previous including abstractions from other facilities); face-to-face time discussing  her lab results and symptoms, medications doses, her options of short and long term treatment based on the latest standards of care / guidelines;   and documenting the encounter.  Meagan Woods  participated in the discussions, expressed understanding, and voiced agreement with the above plans.  All questions were  answered to her satisfaction. she is encouraged to contact clinic should she have any questions or concerns prior to her return visit.    Follow up plan: Return in about 4 months (around 10/08/2022) for Thyroid follow up, Previsit labs, weight management follow up.  Rayetta Pigg, [facility] [address] [place], [place] [address] Phone: [phone] Fax: [phone]  06/08/2022, 3:49 PM

## 2022-06-19 ENCOUNTER — Encounter: Payer: Self-pay | Admitting: Nurse Practitioner

## 2022-06-20 ENCOUNTER — Other Ambulatory Visit (HOSPITAL_COMMUNITY): Payer: Self-pay

## 2022-06-20 ENCOUNTER — Telehealth: Payer: Self-pay

## 2022-06-20 NOTE — Telephone Encounter (Signed)
Pharmacy Patient Advocate Encounter  Received notification from [place] that the request for prior authorization for Zepbound has been denied due to lack of comprehensive weight management program for at least 6 months.      Please be advised we currently do not have a Pharmacist to review denials, therefore you will need to process appeals accordingly as needed. Thanks for your support at this time.   You may call [phone] or fax  [phone], to appeal.

## 2022-06-20 NOTE — Telephone Encounter (Signed)
Patient Advocate Encounter   Received notification from [place] that prior authorization is required for Zepbound 2.5MG /0.5ML pen-injectors  Submitted: 06/20/22 Key [id number]   Status is pending

## 2022-06-21 NOTE — Telephone Encounter (Signed)
Will you please let the patient know?  I recommend she calls her insurance company and see what exactly constitutes as a "weight loss program" so that we can check off those boxes moving forward to try and get her approved in the future.

## 2022-06-23 NOTE — Telephone Encounter (Signed)
Patient was called earlier this week and a message was left on her voicemail with this information.  I ask that she call our office back.

## 2022-10-10 ENCOUNTER — Ambulatory Visit: Payer: 59 | Admitting: Nurse Practitioner

## 2022-12-05 ENCOUNTER — Other Ambulatory Visit: Payer: Self-pay | Admitting: *Deleted

## 2022-12-05 DIAGNOSIS — E559 Vitamin D deficiency, unspecified: Secondary | ICD-10-CM

## 2022-12-05 DIAGNOSIS — E039 Hypothyroidism, unspecified: Secondary | ICD-10-CM

## 2022-12-05 DIAGNOSIS — E6609 Other obesity due to excess calories: Secondary | ICD-10-CM

## 2022-12-06 ENCOUNTER — Ambulatory Visit: Payer: 59 | Admitting: Nurse Practitioner

## 2022-12-06 DIAGNOSIS — E039 Hypothyroidism, unspecified: Secondary | ICD-10-CM

## 2022-12-06 DIAGNOSIS — E559 Vitamin D deficiency, unspecified: Secondary | ICD-10-CM

## 2022-12-06 DIAGNOSIS — Z6839 Body mass index (BMI) 39.0-39.9, adult: Secondary | ICD-10-CM

## 2023-01-23 LAB — T4, FREE: Free T4: 1.36 ng/dL (ref 0.82–1.77)

## 2023-01-23 LAB — VITAMIN D 25 HYDROXY (VIT D DEFICIENCY, FRACTURES): Vit D, 25-Hydroxy: 42.4 ng/mL (ref 30.0–100.0)

## 2023-01-23 LAB — TSH: TSH: 2.15 u[IU]/mL (ref 0.450–4.500)

## 2023-01-26 ENCOUNTER — Ambulatory Visit (INDEPENDENT_AMBULATORY_CARE_PROVIDER_SITE_OTHER): Payer: 59 | Admitting: Nurse Practitioner

## 2023-01-26 ENCOUNTER — Encounter: Payer: Self-pay | Admitting: Nurse Practitioner

## 2023-01-26 VITALS — BP 134/81 | HR 99 | Ht 61.0 in | Wt 219.2 lb

## 2023-01-26 DIAGNOSIS — E559 Vitamin D deficiency, unspecified: Secondary | ICD-10-CM

## 2023-01-26 DIAGNOSIS — E039 Hypothyroidism, unspecified: Secondary | ICD-10-CM | POA: Diagnosis not present

## 2023-01-26 MED ORDER — LEVOTHYROXINE SODIUM 150 MCG PO TABS: 137.0000 ug | ORAL_TABLET | Freq: Every day | ORAL | 3 refills | Status: DC

## 2023-01-26 NOTE — Progress Notes (Signed)
01/26/2023                Endocrinology Follow Up Note   Subjective:    Patient ID: Meagan Woods, female    DOB: Feb 15, 1981, PCP Hungarland, Carlena Bjornstad, MD   Past Medical History:  Diagnosis Date   ADD (attention deficit disorder) without hyperactivity    Depression    Dysphagia    Headache    migraines   Hypothyroidism    Thyroiditis, autoimmune    Past Surgical History:  Procedure Laterality Date   CERVICAL ABLATION  2021   LYMPH NODE DISSECTION Right 2021   TUBAL LIGATION  2020   Social History   Socioeconomic History   Marital status: Married    Spouse name: Not on file   Number of children: Not on file   Years of education: Not on file   Highest education level: Not on file  Occupational History   Not on file  Tobacco Use   Smoking status: Never   Smokeless tobacco: Never  Vaping Use   Vaping status: Never Used  Substance and Sexual Activity   Alcohol use: No   Drug use: No   Sexual activity: Yes  Other Topics Concern   Not on file  Social History Narrative   Not on file   Social Determinants of Health   Financial Resource Strain: Not on file  Food Insecurity: Not on file  Transportation Needs: Not on file  Physical Activity: Not on file  Stress: Not on file  Social Connections: Not on file   Outpatient Encounter Medications as of 01/26/2023  Medication Sig   ALPRAZolam (XANAX) 0.5 MG tablet Take 0.5 mg by mouth at bedtime as needed for anxiety.   amphetamine-dextroamphetamine (ADDERALL XR) 15 MG 24 hr capsule Adderall XR 15 mg capsule,extended release   Cholecalciferol (VITAMIN D3) 125 MCG (5000 UT) CAPS TAKE 1 CAPSULE BY MOUTH ONCE DAILY   venlafaxine XR (EFFEXOR-XR) 150 MG 24 hr capsule Take 150 mg by mouth daily with breakfast.   [DISCONTINUED] levothyroxine (SYNTHROID) 150 MCG tablet Take 1 tablet (150 mcg total) by mouth daily before breakfast.   Blood Glucose Monitoring Suppl (ACCU-CHEK GUIDE) w/Device KIT Use to monitor glucose twice  daily   glucose blood (ACCU-CHEK GUIDE) test strip Use as instructed to monitor glucose twice daily   Lancets (ACCU-CHEK MULTICLIX) lancets Use as instructed to monitor glucose twice daily   levothyroxine (SYNTHROID) 150 MCG tablet Take 1 tablet (150 mcg total) by mouth daily before breakfast.   [DISCONTINUED] tirzepatide (ZEPBOUND) 2.5 MG/0.5ML Pen Inject 2.5 mg into the skin once a week.   [DISCONTINUED] tirzepatide (ZEPBOUND) 5 MG/0.5ML Pen Inject 5 mg into the skin once a week.   [DISCONTINUED] Vitamin D, Ergocalciferol, (DRISDOL) 1.25 MG (50000 UNIT) CAPS capsule Take 1 capsule (50,000 Units total) by mouth every 7 (seven) days. (Patient not taking: Reported on 06/08/2022)   Facility-Administered Encounter Medications as of 01/26/2023  Medication   0.9 %  sodium chloride infusion   ALLERGIES: Allergies  Allergen Reactions   Sulfa Antibiotics Rash   VACCINATION STATUS: Immunization History  Administered Date(s) Administered   Influenza-Unspecified 12/18/2013   Tdap 01/14/2014    Thyroid Problem Presents for follow-up visit. Symptoms include fatigue. Patient reports no anxiety, cold intolerance, constipation, depressed mood, diarrhea, heat intolerance, palpitations, tremors, weight gain or weight loss. The symptoms have been stable.  Hypoglycemia This is a recurrent problem. The current episode started more than 1 month ago. The problem occurs intermittently.  The problem has been waxing and waning. Associated symptoms include fatigue. Associated symptoms comments: Dizziness, sweaty. Exacerbated by: consuming more sugar than usual, meal timing. She has tried eating for the symptoms. The treatment provided moderate relief.   she denies hx of goiter, no neck surgery. She did have postpartum thyroiditis.   Review of systems  Constitutional: + steadily increasing body weight,  current Body mass index is 41.42 kg/m. , no fatigue, + subjective hyperthermia, excessive sweating , no  subjective hypothermia, + increased thirst Eyes: no blurry vision, no xerophthalmia ENT: no sore throat, no nodules palpated in throat, no dysphagia/odynophagia, no hoarseness Cardiovascular: no chest pain, no shortness of breath, no palpitations, no leg swelling Respiratory: no cough, no shortness of breath Gastrointestinal: no nausea/vomiting/diarrhea Musculoskeletal: no muscle/joint aches Skin: no rashes, no hyperemia Neurological: no tremors, no numbness, no tingling, no dizziness Psychiatric: no depression, no anxiety, reports increased stress lately   Objective:    BP 134/81 (BP Location: Left Arm, Patient Position: Sitting, Cuff Size: Large)   Pulse 99   Ht 5\' 1"  (1.549 m)   Wt 219 lb 3.2 oz (99.4 kg)   BMI 41.42 kg/m   Wt Readings from Last 3 Encounters:  01/26/23 219 lb 3.2 oz (99.4 kg)  06/08/22 212 lb 6.4 oz (96.3 kg)  02/07/22 207 lb (93.9 kg)    BP Readings from Last 3 Encounters:  01/26/23 134/81  06/08/22 121/84  07/08/21 127/83    Physical Exam- Limited  Constitutional:  Body mass index is 41.42 kg/m. , not in acute distress, normal state of mind Eyes:  EOMI, no exophthalmos Musculoskeletal: no gross deformities, strength intact in all four extremities, no gross restriction of joint movements Skin:  no rashes, no hyperemia Neurological: no tremor with outstretched hands   April 12, 2018 labs free T4 0.86, TSH 1.9 2 Recent Results (from the past 2160 hour(s))  VITAMIN D 25 Hydroxy (Vit-D Deficiency, Fractures)     Status: None   Collection Time: 01/22/23 11:35 AM  Result Value Ref Range   Vit D, 25-Hydroxy 42.4 30.0 - 100.0 ng/mL    Comment: Vitamin D deficiency has been defined by the Institute of Medicine and an Endocrine Society practice guideline as a level of serum 25-OH vitamin D less than 20 ng/mL (1,2). The Endocrine Society went on to further define vitamin D insufficiency as a level between 21 and 29 ng/mL (2). 1. IOM (Institute of  Medicine). 2010. Dietary reference    intakes for calcium and D. [place]: The    [employer]. 2. Holick MF, Binkley Paris, Bischoff-Ferrari HA, et al.    Evaluation, treatment, and prevention of vitamin D    deficiency: an Endocrine Society clinical practice    guideline. JCEM. 2011 Jul; 96(7):1911-30.   T4, free     Status: None   Collection Time: 01/22/23 11:35 AM  Result Value Ref Range   Free T4 1.36 0.82 - 1.77 ng/dL  TSH     Status: None   Collection Time: 01/22/23 11:35 AM  Result Value Ref Range   TSH 2.150 0.450 - 4.500 uIU/mL     Latest Reference Range & Units 07/05/21 12:09 09/29/21 00:00 01/31/22 00:00 05/31/22 11:20 01/22/23 11:35  TSH 0.450 - 4.500 uIU/mL 3.210 2.32 (E) 2.41 (E) 1.560 2.150  Triiodothyronine,Free,Serum 2.0 - 4.4 pg/mL    3.0   T4,Free(Direct) 0.82 - 1.77 ng/dL 8.11   9.14 7.82  (E): External lab result   Assessment & Plan:  1. Primary hypothyroidism  -Her previsit thyroid function tests are consistent with appropriate hormone replacement.  She is advised to continue her Levothyroxine 150 mcg po daily before breakfast.     - We discussed about the correct intake of her thyroid hormone, on empty stomach at fasting, with water, separated by at least 30 minutes from breakfast and other medications,  and separated by more than 4 hours from calcium, iron, multivitamins, acid reflux medications (PPIs). -Patient is made aware of the fact that thyroid hormone replacement is needed for life, dose to be adjusted by periodic monitoring of thyroid function tests.  2. Vitamin D deficiency -Her most recent vitamin D level from 01/22/23 was 42.4.  She has finished her Rx strength med.  She is advised to continue OTC Vitamin D 3 5000 units daily.    3. 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  She describes the symptoms as dizziness, sweatiness which resolve after eating and  resting.  She does remember at least one occasion where it was after she had consumed more sugar than usual and that another time may have been due to meal timing.  This sounds like reactive hypoglycemia.  She continues to have episodes of this but does not have a glucose meter to check.    I did check A1c today for her excessive thirst and sweating, to rule out DM.  Her POCT A1c today is 5.6%, normal.   The following Lifestyle Medicine recommendations according to American College of Lifestyle Medicine [facility]) were discussed and offered to patient and she agrees to start the journey:  A. Whole Foods, Plant-based plate comprising of fruits and vegetables, plant-based proteins, whole-grain carbohydrates was discussed in detail with the patient.   A list for source of those nutrients were also provided to the patient.  Patient will use only water or unsweetened tea for hydration. B.  The need to stay away from risky substances including alcohol, smoking; obtaining 7 to 9 hours of restorative sleep, at least 150 minutes of moderate intensity exercise weekly, the importance of healthy social connections,  and stress reduction techniques were discussed. C.  A full color page of  Calorie density of various food groups per pound showing examples of each food groups was provided to the patient.  4. Obesity She is interested in aide to help her lose weight.  I had previously sent in [facility] for her but it was never approved for unknown reasons.  I sent in for Zepbound and unfortunately insurance did not cover it.  I am hesitant to prescribe Qsymia or other Phentermine products due to negative outcomes on the heart, she has borderline high BP today.   - I advised patient to maintain close follow up with their PCP for primary care needs.    I spent  27  minutes in the care of the patient today including review of labs from Thyroid Function, CMP, and other relevant labs ; imaging/biopsy records (current and  previous including abstractions from other facilities); face-to-face time discussing  her lab results and symptoms, medications doses, her options of short and long term treatment based on the latest standards of care / guidelines;   and documenting the encounter.  Meagan Woods  participated in the discussions, expressed understanding, and voiced agreement with the above plans.  All questions were answered to her satisfaction. she is encouraged to contact clinic should she have any questions or concerns prior to her  return visit.    Follow up plan: Return in about 4 months (around 05/26/2023) for Thyroid follow up, Previsit labs.  Ronny Bacon, [facility] [address] [place], [place] [address] Phone: [phone] Fax: [phone]  01/26/2023, 11:52 AM

## 2023-01-26 NOTE — Patient Instructions (Signed)
Advice for Weight Management  -For most of us the best way to lose weight is by diet management. Generally speaking, diet management means consuming less calories intentionally which over time brings about progressive weight loss.  This can be achieved more effectively by restricting carbohydrate consumption to the minimum possible.  So, it is critically important to know your numbers: how much calorie you are consuming and how much calorie you need. More importantly, our carbohydrates sources should be unprocessed or minimally processed complex starch food items.   Sometimes, it is important to balance nutrition by increasing protein intake (animal or plant source), fruits, and vegetables.  -Sticking to a routine mealtime to eat 3 meals a day and avoiding unnecessary snacks is shown to have a big role in weight control. Under normal circumstances, the only time we lose real weight is when we are hungry, so allow hunger to take place- hunger means no food between meal times, only water.  It is not advisable to starve.   -It is better to avoid simple carbohydrates including: Cakes, Sweet Desserts, Ice Cream, Soda (diet and regular), Sweet Tea, Candies, Chips, Cookies, Store Bought Juices, Alcohol in Excess of  1-2 drinks a day, Artificial Sweeteners, Doughnuts, Coffee Creamers, "Sugar-free" Products, etc, etc.  This is not a complete list.....    -Consulting with certified diabetes educators is proven to provide you with the most accurate and current information on diet.  Also, you may be  interested in discussing diet options/exchanges , we can schedule a visit with Meagan Woods, RDN, CDE for individualized nutrition education.  -Exercise: If you are able: 30 -60 minutes a day ,4 days a week, or 150 minutes a week.  The longer the better.  Combine stretch, strength, and aerobic activities.  If you were told in the past that you have high risk for cardiovascular diseases, you may seek evaluation by  your heart doctor prior to initiating moderate to intense exercise programs.    

## 2023-06-01 ENCOUNTER — Ambulatory Visit: Payer: 59 | Admitting: Nurse Practitioner

## 2023-06-28 ENCOUNTER — Other Ambulatory Visit (HOSPITAL_COMMUNITY): Payer: Self-pay | Admitting: Obstetrics and Gynecology

## 2023-06-28 DIAGNOSIS — N644 Mastodynia: Secondary | ICD-10-CM

## 2023-07-13 LAB — T4, FREE: Free T4: 1.57 ng/dL (ref 0.82–1.77)

## 2023-07-13 LAB — TSH: TSH: 0.942 u[IU]/mL (ref 0.450–4.500)

## 2023-07-17 ENCOUNTER — Inpatient Hospital Stay
Admission: RE | Admit: 2023-07-17 | Discharge: 2023-07-17 | Disposition: A | Discharge status: Home / Self Care | Payer: Self-pay | Source: Ambulatory Visit | Attending: Obstetrics and Gynecology | Admitting: Obstetrics and Gynecology

## 2023-07-17 ENCOUNTER — Other Ambulatory Visit (HOSPITAL_COMMUNITY): Payer: Self-pay | Admitting: Obstetrics and Gynecology

## 2023-07-17 DIAGNOSIS — N644 Mastodynia: Secondary | ICD-10-CM

## 2023-07-19 ENCOUNTER — Ambulatory Visit (INDEPENDENT_AMBULATORY_CARE_PROVIDER_SITE_OTHER): Admitting: Nurse Practitioner

## 2023-07-19 ENCOUNTER — Encounter: Payer: Self-pay | Admitting: Nurse Practitioner

## 2023-07-19 ENCOUNTER — Ambulatory Visit (HOSPITAL_COMMUNITY)
Admission: RE | Admit: 2023-07-19 | Discharge: 2023-07-19 | Disposition: A | Discharge status: Home / Self Care | Source: Ambulatory Visit | Attending: Obstetrics and Gynecology | Admitting: Obstetrics and Gynecology

## 2023-07-19 ENCOUNTER — Encounter (HOSPITAL_COMMUNITY): Payer: Self-pay

## 2023-07-19 VITALS — BP 122/86 | HR 109 | Ht 61.0 in | Wt 214.0 lb

## 2023-07-19 DIAGNOSIS — E559 Vitamin D deficiency, unspecified: Secondary | ICD-10-CM

## 2023-07-19 DIAGNOSIS — E039 Hypothyroidism, unspecified: Secondary | ICD-10-CM

## 2023-07-19 DIAGNOSIS — N644 Mastodynia: Secondary | ICD-10-CM

## 2023-07-19 DIAGNOSIS — E66812 Obesity, class 2: Secondary | ICD-10-CM | POA: Diagnosis not present

## 2023-07-19 DIAGNOSIS — Z6839 Body mass index (BMI) 39.0-39.9, adult: Secondary | ICD-10-CM

## 2023-07-19 DIAGNOSIS — R5382 Chronic fatigue, unspecified: Secondary | ICD-10-CM

## 2023-07-19 DIAGNOSIS — E6609 Other obesity due to excess calories: Secondary | ICD-10-CM

## 2023-07-19 MED ORDER — LEVOTHYROXINE SODIUM 150 MCG PO TABS: 137.0000 ug | ORAL_TABLET | Freq: Every day | ORAL | 3 refills | Status: AC

## 2023-07-19 NOTE — Patient Instructions (Signed)

## 2023-07-19 NOTE — Progress Notes (Signed)
 07/19/2023                Endocrinology Follow Up Note   Subjective:    Patient ID: Meagan Woods, female    DOB: 01/11/1981, PCP Hungarland, Charmian Coots, MD   Past Medical History:  Diagnosis Date   ADD (attention deficit disorder) without hyperactivity    Depression    Dysphagia    Headache    migraines   Hypothyroidism    Thyroiditis, autoimmune    Past Surgical History:  Procedure Laterality Date   CERVICAL ABLATION  2021   LYMPH NODE DISSECTION Right 2021   TUBAL LIGATION  2020   Social History   Socioeconomic History   Marital status: Married    Spouse name: Not on file   Number of children: Not on file   Years of education: Not on file   Highest education level: Not on file  Occupational History   Not on file  Tobacco Use   Smoking status: Never   Smokeless tobacco: Never  Vaping Use   Vaping status: Never Used  Substance and Sexual Activity   Alcohol use: No   Drug use: No   Sexual activity: Yes  Other Topics Concern   Not on file  Social History Narrative   Not on file   Social Drivers of Health   Financial Resource Strain: Not on file  Food Insecurity: Not on file  Transportation Needs: Not on file  Physical Activity: Not on file  Stress: Not on file  Social Connections: Not on file   Outpatient Encounter Medications as of 07/19/2023  Medication Sig   ALPRAZolam (XANAX) 0.5 MG tablet Take 0.5 mg by mouth at bedtime as needed for anxiety.   amphetamine-dextroamphetamine (ADDERALL XR) 15 MG 24 hr capsule Adderall XR 15 mg capsule,extended release   Cholecalciferol (VITAMIN D3) 125 MCG (5000 UT) CAPS TAKE 1 CAPSULE BY MOUTH ONCE DAILY   venlafaxine XR (EFFEXOR-XR) 150 MG 24 hr capsule Take 150 mg by mouth daily with breakfast.   [DISCONTINUED] levothyroxine  (SYNTHROID ) 150 MCG tablet Take 1 tablet (150 mcg total) by mouth daily before breakfast.   levothyroxine  (SYNTHROID ) 150 MCG tablet Take 1 tablet (150 mcg total) by mouth daily before  breakfast.   [DISCONTINUED] Blood Glucose Monitoring Suppl (ACCU-CHEK GUIDE) w/Device KIT Use to monitor glucose twice daily   [DISCONTINUED] glucose blood (ACCU-CHEK GUIDE) test strip Use as instructed to monitor glucose twice daily   [DISCONTINUED] Lancets (ACCU-CHEK MULTICLIX) lancets Use as instructed to monitor glucose twice daily   Facility-Administered Encounter Medications as of 07/19/2023  Medication   0.9 %  sodium chloride  infusion   ALLERGIES: Allergies  Allergen Reactions   Sulfa Antibiotics Rash   VACCINATION STATUS: Immunization History  Administered Date(s) Administered   Influenza-Unspecified 12/18/2013   Tdap 01/14/2014    Thyroid Problem Presents for follow-up visit. Symptoms include fatigue. Patient reports no anxiety, cold intolerance, constipation, depressed mood, diarrhea, heat intolerance, palpitations, tremors, weight gain or weight loss. The symptoms have been stable.  Hypoglycemia This is a recurrent problem. The current episode started more than 1 month ago. The problem occurs intermittently. The problem has been waxing and waning. Associated symptoms include fatigue. Associated symptoms comments: Dizziness, sweaty. Exacerbated by: consuming more sugar than usual, meal timing. She has tried eating for the symptoms. The treatment provided moderate relief.   she denies hx of goiter, no neck surgery. She did have postpartum thyroiditis.   Review of systems  Constitutional: + Minimally fluctuating  body weight,  current Body mass index is 40.43 kg/m. , + fatigue, no subjective hyperthermia, no subjective hypothermia Eyes: no blurry vision, no xerophthalmia ENT: no sore throat, no nodules palpated in throat, no dysphagia/odynophagia, no hoarseness Cardiovascular: no chest pain, no shortness of breath, no palpitations, no leg swelling Respiratory: no cough, no shortness of breath Gastrointestinal: no nausea/vomiting/diarrhea Musculoskeletal: no muscle/joint  aches Skin: no rashes, no hyperemia Neurological: no tremors, no numbness, no tingling, no dizziness Psychiatric: no depression, no anxiety   Objective:    BP 122/86 (BP Location: Right Arm, Patient Position: Sitting, Cuff Size: Large)   Pulse (!) 109   Ht 5\' 1"  (1.549 m)   Wt 214 lb (97.1 kg)   BMI 40.43 kg/m   Wt Readings from Last 3 Encounters:  07/19/23 214 lb (97.1 kg)  01/26/23 219 lb 3.2 oz (99.4 kg)  06/08/22 212 lb 6.4 oz (96.3 kg)    BP Readings from Last 3 Encounters:  07/19/23 122/86  01/26/23 134/81  06/08/22 121/84    Physical Exam- Limited  Constitutional:  Body mass index is 40.43 kg/m. , not in acute distress, normal state of mind Eyes:  EOMI, no exophthalmos Musculoskeletal: no gross deformities, strength intact in all four extremities, no gross restriction of joint movements Skin:  no rashes, no hyperemia Neurological: no tremor with outstretched hands   April 12, 2018 labs free T4 0.86, TSH 1.9 2 Recent Results (from the past 2160 hours)  TSH     Status: None   Collection Time: 07/12/23 11:47 AM  Result Value Ref Range   TSH 0.942 0.450 - 4.500 uIU/mL  T4, free     Status: None   Collection Time: 07/12/23 11:47 AM  Result Value Ref Range   Free T4 1.57 0.82 - 1.77 ng/dL     Latest Reference Range & Units 07/05/21 12:09 09/29/21 00:00 01/31/22 00:00 05/31/22 11:20 01/22/23 11:35 07/12/23 11:47  TSH 0.450 - 4.500 uIU/mL 3.210 2.32 (E) 2.41 (E) 1.560 2.150 0.942  Triiodothyronine,Free,Serum 2.0 - 4.4 pg/mL    3.0    T4,Free(Direct) 0.82 - 1.77 ng/dL 0.45   4.09 8.11 9.14  (E): External lab result   Assessment & Plan:   1. Primary hypothyroidism   -Her previsit thyroid function tests are consistent with appropriate hormone replacement.  She is advised to continue her Levothyroxine  150 mcg po daily before breakfast.    - We discussed about the correct intake of her thyroid hormone, on empty stomach at fasting, with water, separated by at  least 30 minutes from breakfast and other medications,  and separated by more than 4 hours from calcium, iron, multivitamins, acid reflux medications (PPIs). -Patient is made aware of the fact that thyroid hormone replacement is needed for life, dose to be adjusted by periodic monitoring of thyroid function tests.  2. Vitamin D  deficiency -Her most recent vitamin D  level from 01/22/23 was 42.4.  She has finished her Rx strength med.  She is advised to continue OTC Vitamin D  3 5000 units daily.  Will recheck vitamin d  prior to next visit.  3. 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  She describes the symptoms as dizziness, sweatiness which resolve after eating and resting.  She does remember at least one occasion where it was after she had consumed more sugar than usual and that another time may have been due to meal timing.  This sounds like reactive hypoglycemia.  She  continues to have episodes of this but does not have a glucose meter to check.  She notes she has had one or 2 occasions of this since last visit, seems to be improving.  The following Lifestyle Medicine recommendations according to American College of Lifestyle Medicine [facility]) were discussed and offered to patient and she agrees to start the journey:  A. Whole Foods, Plant-based plate comprising of fruits and vegetables, plant-based proteins, whole-grain carbohydrates was discussed in detail with the patient.   A list for source of those nutrients were also provided to the patient.  Patient will use only water or unsweetened tea for hydration. B.  The need to stay away from risky substances including alcohol, smoking; obtaining 7 to 9 hours of restorative sleep, at least 150 minutes of moderate intensity exercise weekly, the importance of healthy social connections,  and stress reduction techniques were discussed. C.  A full color page of  Calorie density of various  food groups per pound showing examples of each food groups was provided to the patient.  4. Obesity She is interested in aide to help her lose weight.  I had previously sent in [facility]  for her but it was never approved for unknown reasons.  I sent in for Zepbound  and unfortunately insurance did not cover it.  I am hesitant to prescribe Qsymia or other Phentermine products due to negative outcomes on the heart.  5. Fatigue Will add b12 and iron panel to next set of labs to help identify any other potential etiology for her chronic fatigue.   - I advised patient to maintain close follow up with their PCP for primary care needs.    I spent  22  minutes in the care of the patient today including review of labs from Thyroid Function, CMP, and other relevant labs ; imaging/biopsy records (current and previous including abstractions from other facilities); face-to-face time discussing  her lab results and symptoms, medications doses, her options of short and long term treatment based on the latest standards of care / guidelines;   and documenting the encounter.  Meagan Woods  participated in the discussions, expressed understanding, and voiced agreement with the above plans.  All questions were answered to her satisfaction. she is encouraged to contact clinic should she have any questions or concerns prior to her return visit.    Follow up plan: Return in about 4 months (around 11/19/2023) for Thyroid follow up, Previsit labs.  Hulon Magic, [facility] [address] [place], [place] [address] Phone: [phone] Fax: [phone]  07/19/2023, 8:23 AM

## 2023-11-21 ENCOUNTER — Ambulatory Visit: Admitting: Nurse Practitioner

## 2024-02-12 ENCOUNTER — Ambulatory Visit: Admitting: Nurse Practitioner

## 2024-03-03 ENCOUNTER — Ambulatory Visit: Admitting: Nurse Practitioner

## 2024-03-03 DIAGNOSIS — E039 Hypothyroidism, unspecified: Secondary | ICD-10-CM

## 2024-03-03 DIAGNOSIS — E559 Vitamin D deficiency, unspecified: Secondary | ICD-10-CM
